# Patient Record
Sex: Male | Born: 1985
Health system: Southern US, Community
[De-identification: ages and names within clinical notes are randomized; demographics above are authoritative.]

## PROBLEM LIST (undated history)

## (undated) DIAGNOSIS — I10 Essential (primary) hypertension: Secondary | ICD-10-CM

## (undated) DIAGNOSIS — B20 Human immunodeficiency virus [HIV] disease: Secondary | ICD-10-CM

## (undated) DIAGNOSIS — Z21 Asymptomatic human immunodeficiency virus [HIV] infection status: Secondary | ICD-10-CM

## (undated) HISTORY — DX: Asymptomatic human immunodeficiency virus (hiv) infection status: Z21

## (undated) HISTORY — DX: Human immunodeficiency virus (HIV) disease: B20

---

## 2016-06-11 ENCOUNTER — Ambulatory Visit (INDEPENDENT_AMBULATORY_CARE_PROVIDER_SITE_OTHER): Payer: BLUE CROSS/BLUE SHIELD | Admitting: Physician Assistant

## 2016-06-11 VITALS — BP 170/100 | HR 93 | Temp 98.1°F | Resp 18 | Ht 71.0 in | Wt 301.0 lb

## 2016-06-11 DIAGNOSIS — K625 Hemorrhage of anus and rectum: Secondary | ICD-10-CM | POA: Diagnosis not present

## 2016-06-11 DIAGNOSIS — R079 Chest pain, unspecified: Secondary | ICD-10-CM

## 2016-06-11 DIAGNOSIS — K219 Gastro-esophageal reflux disease without esophagitis: Secondary | ICD-10-CM

## 2016-06-11 DIAGNOSIS — I10 Essential (primary) hypertension: Secondary | ICD-10-CM

## 2016-06-11 LAB — GLUCOSE, POCT (MANUAL RESULT ENTRY): POC GLUCOSE: 105 mg/dL — AB (ref 70–99)

## 2016-06-11 LAB — POCT CBC
GRANULOCYTE PERCENT: 55.2 % (ref 37–80)
HCT, POC: 39.2 % — AB (ref 43.5–53.7)
Hemoglobin: 13.6 g/dL — AB (ref 14.1–18.1)
Lymph, poc: 3.6 — AB (ref 0.6–3.4)
MCH: 28.4 pg (ref 27–31.2)
MCHC: 34.8 g/dL (ref 31.8–35.4)
MCV: 81.4 fL (ref 80–97)
MID (CBC): 1.1 — AB (ref 0–0.9)
MPV: 7.2 fL (ref 0–99.8)
PLATELET COUNT, POC: 278 10*3/uL (ref 142–424)
POC GRANULOCYTE: 5.7 (ref 2–6.9)
POC LYMPH PERCENT: 34.7 %L (ref 10–50)
POC MID %: 10.1 %M (ref 0–12)
RBC: 4.81 M/uL (ref 4.69–6.13)
RDW, POC: 14.2 %
WBC: 10.4 10*3/uL — AB (ref 4.6–10.2)

## 2016-06-11 LAB — POC HEMOCCULT BLD/STL (OFFICE/1-CARD/DIAGNOSTIC): Fecal Occult Blood, POC: NEGATIVE

## 2016-06-11 LAB — POCT URINALYSIS DIP (MANUAL ENTRY)
BILIRUBIN UA: NEGATIVE
Bilirubin, UA: NEGATIVE
GLUCOSE UA: NEGATIVE
Leukocytes, UA: NEGATIVE
Nitrite, UA: NEGATIVE
SPEC GRAV UA: 1.02
UROBILINOGEN UA: 1
pH, UA: 7

## 2016-06-11 MED ORDER — RANITIDINE HCL 150 MG PO TABS
150.0000 mg | ORAL_TABLET | Freq: Two times a day (BID) | ORAL | Status: DC
Start: 2016-06-11 — End: 2018-06-10

## 2016-06-11 MED ORDER — AMLODIPINE BESYLATE 5 MG PO TABS: 5.0000 mg | ORAL_TABLET | Freq: Every day | ORAL | Status: DC

## 2016-06-11 MED ORDER — HYDROCORTISONE ACETATE 25 MG RE SUPP: 25.0000 mg | Freq: Two times a day (BID) | RECTAL | Status: DC

## 2016-06-11 MED ORDER — OMEPRAZOLE 20 MG PO CPDR: 20.0000 mg | DELAYED_RELEASE_CAPSULE | Freq: Every day | ORAL | Status: DC

## 2016-06-11 NOTE — Patient Instructions (Addendum)
IF you received an x-ray today, you will receive an invoice from [facility]. Please contact [facility] at [phone] with questions or concerns regarding your invoice.   IF you received labwork today, you will receive an invoice from [employer]. Please contact [facility] at [phone] with questions or concerns regarding your invoice.   Our billing staff will not be able to assist you with questions regarding bills from these companies.  You will be contacted with the lab results as soon as they are available. The fastest way to get your results is to activate your My Chart account. Instructions are located on the last page of this paperwork. If you have not heard from [place] regarding the results in 2 weeks, please contact this office.    Please take the reflux medication at this time.  I would like you to not engage in strenuous exercise until you are cleared by the cardiologist. Please await this contact for cardiology.   Follow the gerd restriction diet at this time. I would like you to take the amlodipine at this time.  Let's follow up in 2 weeks, unless this is performed with the cardiologist.  I need you to also look at this DASH diet and be mindful of your diet.  DASH Eating Plan DASH stands for "Dietary Approaches to Stop Hypertension." The DASH eating plan is a healthy eating plan that has been shown to reduce high blood pressure (hypertension). Additional health benefits may include reducing the risk of type 2 diabetes mellitus, heart disease, and stroke. The DASH eating plan may also help with weight loss. WHAT DO I NEED TO KNOW ABOUT THE DASH EATING PLAN? For the DASH eating plan, you will follow these general guidelines:  Choose foods with a percent daily value for sodium of less than 5% (as listed on the food label).  Use salt-free seasonings or herbs instead of table salt or sea salt.  Check with your health care provider or  pharmacist before using salt substitutes.  Eat lower-sodium products, often labeled as "lower sodium" or "no salt added."  Eat fresh foods.  Eat more vegetables, fruits, and low-fat dairy products.  Choose whole grains. Look for the word "whole" as the first word in the ingredient list.  Choose fish and skinless chicken or [place] more often than red meat. Limit fish, poultry, and meat to 6 oz (170 g) each day.  Limit sweets, desserts, sugars, and sugary drinks.  Choose heart-healthy fats.  Limit cheese to 1 oz (28 g) per day.  Eat more home-cooked food and less restaurant, buffet, and fast food.  Limit fried foods.  Cook foods using methods other than frying.  Limit canned vegetables. If you do use them, rinse them well to decrease the sodium.  When eating at a restaurant, ask that your food be prepared with less salt, or no salt if possible. WHAT FOODS CAN I EAT? Seek help from a dietitian for individual calorie needs. Grains Whole grain or whole wheat bread. Brown rice. Whole grain or whole wheat pasta. Quinoa, bulgur, and whole grain cereals. Low-sodium cereals. Corn or whole wheat flour tortillas. Whole grain cornbread. Whole grain crackers. Low-sodium crackers. Vegetables Fresh or frozen vegetables (raw, steamed, roasted, or grilled). Low-sodium or reduced-sodium tomato and vegetable juices. Low-sodium or reduced-sodium tomato sauce and paste. Low-sodium or reduced-sodium canned vegetables.  Fruits All fresh, canned (in natural juice), or frozen fruits. Meat and Other Protein Products Ground beef (85% or leaner), grass-fed  beef, or beef trimmed of fat. Skinless chicken or [place]. Ground chicken or [place]. Pork trimmed of fat. All fish and seafood. Eggs. Dried beans, peas, or lentils. Unsalted nuts and seeds. Unsalted canned beans. Dairy Low-fat dairy products, such as skim or 1% milk, 2% or reduced-fat cheeses, low-fat ricotta or cottage cheese, or plain low-fat yogurt.  Low-sodium or reduced-sodium cheeses. Fats and Oils Tub margarines without trans fats. Light or reduced-fat mayonnaise and salad dressings (reduced sodium). Avocado. Safflower, olive, or canola oils. Natural peanut or almond butter. Other Unsalted popcorn and pretzels. The items listed above may not be a complete list of recommended foods or beverages. Contact your dietitian for more options. WHAT FOODS ARE NOT RECOMMENDED? Grains White bread. White pasta. White rice. Refined cornbread. Bagels and croissants. Crackers that contain trans fat. Vegetables Creamed or fried vegetables. Vegetables in a cheese sauce. Regular canned vegetables. Regular canned tomato sauce and paste. Regular tomato and vegetable juices. Fruits Dried fruits. Canned fruit in light or heavy syrup. Fruit juice. Meat and Other Protein Products Fatty cuts of meat. Ribs, chicken wings, bacon, sausage, bologna, salami, chitterlings, fatback, hot dogs, bratwurst, and packaged luncheon meats. Salted nuts and seeds. Canned beans with salt. Dairy Whole or 2% milk, cream, half-and-half, and cream cheese. Whole-fat or sweetened yogurt. Full-fat cheeses or blue cheese. Nondairy creamers and whipped toppings. Processed cheese, cheese spreads, or cheese curds. Condiments Onion and garlic salt, seasoned salt, table salt, and sea salt. Canned and packaged gravies. Worcestershire sauce. Tartar sauce. Barbecue sauce. Teriyaki sauce. Soy sauce, including reduced sodium. Steak sauce. Fish sauce. Oyster sauce. Cocktail sauce. Horseradish. Ketchup and mustard. Meat flavorings and tenderizers. Bouillon cubes. Hot sauce. Tabasco sauce. Marinades. Taco seasonings. Relishes. Fats and Oils Butter, stick margarine, lard, shortening, ghee, and bacon fat. Coconut, palm kernel, or palm oils. Regular salad dressings. Other Pickles and olives. Salted popcorn and pretzels. The items listed above may not be a complete list of foods and beverages to avoid.  Contact your dietitian for more information. WHERE CAN I FIND MORE INFORMATION? National Heart, Lung, and Blood Institute: [employer]   This information is not intended to replace advice given to you by your health care provider. Make sure you discuss any questions you have with your health care provider.   Document Released: 11/08/2011 Document Revised: 12/10/2014 Document Reviewed: 09/23/2013 Elsevier Interactive Patient Education 2016 [employer].  Food Choices for Gastroesophageal Reflux Disease, Adult When you have gastroesophageal reflux disease (GERD), the foods you eat and your eating habits are very important. Choosing the right foods can help ease the discomfort of GERD. WHAT GENERAL GUIDELINES DO I NEED TO FOLLOW?  Choose fruits, vegetables, whole grains, low-fat dairy products, and low-fat meat, fish, and poultry.  Limit fats such as oils, salad dressings, butter, nuts, and avocado.  Keep a food diary to identify foods that cause symptoms.  Avoid foods that cause reflux. These may be different for different people.  Eat frequent small meals instead of three large meals each day.  Eat your meals slowly, in a relaxed setting.  Limit fried foods.  Cook foods using methods other than frying.  Avoid drinking alcohol.  Avoid drinking large amounts of liquids with your meals.  Avoid bending over or lying down until 2-3 hours after eating. WHAT FOODS ARE NOT RECOMMENDED? The following are some foods and drinks that may worsen your symptoms: Vegetables Tomatoes. Tomato juice. Tomato and spaghetti sauce. Chili peppers. Onion and garlic. Horseradish. Fruits Oranges, grapefruit, and lemon (fruit  and juice). Meats High-fat meats, fish, and poultry. This includes hot dogs, ribs, ham, sausage, salami, and bacon. Dairy Whole milk and chocolate milk. Sour cream. Cream. Butter. Ice cream. Cream cheese.  Beverages Coffee and tea, with  or without caffeine. Carbonated beverages or energy drinks. Condiments Hot sauce. Barbecue sauce.  Sweets/Desserts Chocolate and cocoa. Donuts. Peppermint and spearmint. Fats and Oils High-fat foods, including [place] fries and potato chips. Other Vinegar. Strong spices, such as black pepper, white pepper, red pepper, cayenne, curry powder, cloves, ginger, and chili powder. The items listed above may not be a complete list of foods and beverages to avoid. Contact your dietitian for more information.   This information is not intended to replace advice given to you by your health care provider. Make sure you discuss any questions you have with your health care provider.   Document Released: 11/19/2005 Document Revised: 12/10/2014 Document Reviewed: 09/23/2013 Elsevier Interactive Patient Education [employer]2016 Elsevier Inc.

## 2016-06-12 ENCOUNTER — Ambulatory Visit: Payer: Self-pay

## 2016-06-12 LAB — COMPLETE METABOLIC PANEL WITH GFR
ALBUMIN: 4.1 g/dL (ref 3.6–5.1)
ALK PHOS: 80 U/L (ref 40–115)
ALT: 37 U/L (ref 9–46)
AST: 20 U/L (ref 10–40)
BILIRUBIN TOTAL: 0.3 mg/dL (ref 0.2–1.2)
BUN: 7 mg/dL (ref 7–25)
CALCIUM: 9.3 mg/dL (ref 8.6–10.3)
CO2: 28 mmol/L (ref 20–31)
CREATININE: 0.9 mg/dL (ref 0.60–1.35)
Chloride: 103 mmol/L (ref 98–110)
Glucose, Bld: 103 mg/dL — ABNORMAL HIGH (ref 65–99)
Potassium: 4.1 mmol/L (ref 3.5–5.3)
Sodium: 139 mmol/L (ref 135–146)
TOTAL PROTEIN: 8.5 g/dL — AB (ref 6.1–8.1)

## 2016-06-14 NOTE — Progress Notes (Signed)
[facility] [address], [place] [address] [phone]  Date:  06/11/2016   Name:  Peter Herring   DOB:  19-Sep-1986   MRN:  [id number]  PCP:  PROVIDER NOT IN SYSTEM    History of Present Illness:  Peter Herring is a 30 y.o. male patient with HIV, who presents to [facility] for cc of chest pain.   --chest pain intermittently for 1 year.  It is characterized as a pressure pain across the top of his chest.  Without sob or dyspnea.  No association.  There is some nausea.  No dizziness or leg swelling.  He has a hx of hypertension.  Was taken off meds by pcp due to elevated bp resolving.   --yesterday, he developed rectal bleeding.  It was generous on the toilet paper, and minimal in stool.  No black stool.  He has had no blood on his underwear today.  He recalls that he did have constipation yesterday.  He is sexual active with anal intercourse.  No fever, unintentional weight loss.      There are no active problems to display for this patient.   Past Medical History  Diagnosis Date  . HIV infection (HCC)     No past surgical history on file.  Social History  Substance Use Topics  . Smoking status: Former Games developer  . Smokeless tobacco: None  . Alcohol Use: None    Family History  Problem Relation Age of Onset  . Cancer Mother   . Diabetes Mother   . Diabetes Father   . Heart disease Father   . Diabetes Sister     Not on File  Medication list has been reviewed and updated.  No current outpatient prescriptions on file prior to visit.   No current facility-administered medications on file prior to visit.    ROS ROS otherwise unremarkable unless listed above.   Physical Examination: BP 170/100 mmHg  Pulse 93  Temp(Src) 98.1 F (36.7 C) (Oral)  Resp 18  Ht  (1.803 m)  Wt 301 lb (136.533 kg)  BMI 42.00 kg/m2  SpO2 99% Ideal Body Weight: Weight in (lb) to have BMI = 25: 178.9 Physical Exam  Constitutional: He is oriented to person,  place, and time. He appears well-developed and well-nourished. No distress.  HENT:  Head: Normocephalic and atraumatic.  Eyes: Conjunctivae and EOM are normal. Pupils are equal, round, and reactive to light.  Cardiovascular: Normal rate and regular rhythm.  Exam reveals no gallop and no friction rub.   No murmur heard. Pulses:      Carotid pulses are 2+ on the right side, and 2+ on the left side.      Dorsalis pedis pulses are 2+ on the right side, and 2+ on the left side.  Pulmonary/Chest: Effort normal. No respiratory distress.  Genitourinary: Rectum normal. Rectal exam shows no mass and anal tone normal. Guaiac negative stool.  gu with 6 oclock pallor at the anus with minimal tenderness.  No hemorrhoid detected internal or external.    Neurological: He is alert and oriented to person, place, and time.  Skin: Skin is warm and dry. He is not diaphoretic.  Psychiatric: He has a normal mood and affect. His behavior is normal.   Results for orders placed or performed in visit on 06/11/16  COMPLETE METABOLIC PANEL WITH GFR  Result Value Ref Range   Sodium 139 135 - 146 mmol/L   Potassium 4.1 3.5 - 5.3 mmol/L  Chloride 103 98 - 110 mmol/L   CO2 28 20 - 31 mmol/L   Glucose, Bld 103 (H) 65 - 99 mg/dL   BUN 7 7 - 25 mg/dL   Creat 1.610.90 0.960.60 - 0.451.35 mg/dL   Total Bilirubin 0.3 0.2 - 1.2 mg/dL   Alkaline Phosphatase 80 40 - 115 U/L   AST 20 10 - 40 U/L   ALT 37 9 - 46 U/L   Total Protein 8.5 (H) 6.1 - 8.1 g/dL   Albumin 4.1 3.6 - 5.1 g/dL   Calcium 9.3 8.6 - 40.910.3 mg/dL   GFR, Est African American >89 >=60 mL/min   GFR, Est Non African American >89 >=60 mL/min  POCT CBC  Result Value Ref Range   WBC 10.4 (A) 4.6 - 10.2 K/uL   Lymph, poc 3.6 (A) 0.6 - 3.4   POC LYMPH PERCENT 34.7 10 - 50 %L   MID (cbc) 1.1 (A) 0 - 0.9   POC MID % 10.1 0 - 12 %M   POC Granulocyte 5.7 2 - 6.9   Granulocyte percent 55.2 37 - 80 %G   RBC 4.81 4.69 - 6.13 M/uL   Hemoglobin 13.6 (A) 14.1 - 18.1 g/dL    HCT, POC 81.139.2 (A) 91.443.5 - 53.7 %   MCV 81.4 80 - 97 fL   MCH, POC 28.4 27 - 31.2 pg   MCHC 34.8 31.8 - 35.4 g/dL   RDW, POC 78.214.2 %   Platelet Count, POC 278 142 - 424 K/uL   MPV 7.2 0 - 99.8 fL  POCT urinalysis dipstick  Result Value Ref Range   Color, UA yellow yellow   Clarity, UA clear clear   Glucose, UA negative negative   Bilirubin, UA negative negative   Ketones, POC UA negative negative   Spec Grav, UA 1.020    Blood, UA trace-intact (A) negative   pH, UA 7.0    Protein Ur, POC trace (A) negative   Urobilinogen, UA 1.0    Nitrite, UA Negative Negative   Leukocytes, UA Negative Negative  POCT glucose (manual entry)  Result Value Ref Range   POC Glucose 105 (A) 70 - 99 mg/dl  POC Hemoccult Bld/Stl (1-Cd Office Dx)  Result Value Ref Range   Card #1 Date 06/11/2016    Fecal Occult Blood, POC Negative Negative    Assessment and Plan: Peter Herring is a 30 y.o. male who is here today for chest pain. --likely GERD, however this should be cleared by a cardiologist.  Cardiology consult appreciated at this time. --given h2 blocker and PPI --rectal bleeding possible fissure, however will treat with anusol.  Advised miralax use. --bp rechecked at 154/98 Chest pain, unspecified chest pain type - Plan: EKG 12-Lead, POCT CBC, POCT urinalysis dipstick, COMPLETE METABOLIC PANEL WITH GFR, POCT glucose (manual entry), Ambulatory referral to Cardiology, omeprazole (PRILOSEC) 20 MG capsule, ranitidine (ZANTAC) 150 MG tablet  Rectal bleeding - Plan: POC Hemoccult Bld/Stl (1-Cd Office Dx), hydrocortisone (ANUSOL-HC) 25 MG suppository  Gastroesophageal reflux disease, esophagitis presence not specified  Essential hypertension - Plan: amLODipine (NORVASC) 5 MG tablet  Trena PlattStephanie English, PA-C [facility] 06/14/2016 4:33 PM  Will change to lisinopril--given self-reported use of retro antiviral, prevcobix.

## 2016-06-15 ENCOUNTER — Telehealth: Payer: Self-pay | Admitting: Physician Assistant

## 2016-06-15 MED ORDER — LISINOPRIL 10 MG PO TABS: 10.0000 mg | ORAL_TABLET | Freq: Every day | ORAL | Status: DC

## 2016-06-15 NOTE — Telephone Encounter (Signed)
Please contact patient, and advise that I am going to switch his med to lisinopril.  I do not want him to take this with his current medicines.  Please stop the amlodipine.

## 2016-06-15 NOTE — Telephone Encounter (Signed)
Attempted to call pt, left VM for pt to call back asap  

## 2016-06-19 NOTE — Telephone Encounter (Signed)
[facility]

## 2016-06-21 NOTE — Telephone Encounter (Signed)
Sent unable to reach letter and send a copy of the message.

## 2018-01-01 ENCOUNTER — Ambulatory Visit: Payer: BLUE CROSS/BLUE SHIELD | Admitting: Emergency Medicine

## 2018-01-01 DIAGNOSIS — J1089 Influenza due to other identified influenza virus with other manifestations: Secondary | ICD-10-CM | POA: Diagnosis not present

## 2018-01-01 DIAGNOSIS — R030 Elevated blood-pressure reading, without diagnosis of hypertension: Secondary | ICD-10-CM | POA: Diagnosis not present

## 2018-04-15 ENCOUNTER — Ambulatory Visit (HOSPITAL_COMMUNITY)
Admission: EM | Admit: 2018-04-15 | Discharge: 2018-04-15 | Disposition: A | Discharge status: Home / Self Care | Payer: BLUE CROSS/BLUE SHIELD | Attending: Family Medicine | Admitting: Family Medicine

## 2018-04-15 ENCOUNTER — Encounter (HOSPITAL_COMMUNITY): Payer: Self-pay | Admitting: Family Medicine

## 2018-04-15 DIAGNOSIS — I10 Essential (primary) hypertension: Secondary | ICD-10-CM

## 2018-04-15 DIAGNOSIS — R0789 Other chest pain: Secondary | ICD-10-CM | POA: Diagnosis not present

## 2018-04-15 HISTORY — DX: Essential (primary) hypertension: I10

## 2018-04-15 LAB — POCT I-STAT, CHEM 8
BUN: 6 mg/dL (ref 6–20)
CREATININE: 0.7 mg/dL (ref 0.61–1.24)
Calcium, Ion: 1.17 mmol/L (ref 1.15–1.40)
Chloride: 105 mmol/L (ref 101–111)
GLUCOSE: 112 mg/dL — AB (ref 65–99)
HCT: 41 % (ref 39.0–52.0)
HEMOGLOBIN: 13.9 g/dL (ref 13.0–17.0)
POTASSIUM: 3.4 mmol/L — AB (ref 3.5–5.1)
Sodium: 142 mmol/L (ref 135–145)
TCO2: 24 mmol/L (ref 22–32)

## 2018-04-15 MED ORDER — LISINOPRIL 10 MG PO TABS: 10.0000 mg | ORAL_TABLET | Freq: Every day | ORAL | 1 refills | Status: DC

## 2018-04-15 MED ORDER — AMLODIPINE BESYLATE 5 MG PO TABS
5.0000 mg | ORAL_TABLET | Freq: Every day | ORAL | 1 refills | Status: DC
Start: 2018-04-15 — End: 2018-06-10

## 2018-04-15 NOTE — ED Provider Notes (Signed)
[facility]    CSN: [id number] Arrival date & time: 04/15/18  1210     History   Chief Complaint Chief Complaint  Patient presents with  . Hypertension    HPI Gergory Biello is a 32 y.o. male.   32 year old male with history of HIV, hypertension comes in due to hypertension.  States moved here from [place] 2.5 years ago, and discontinued all his medications as he could not find a PCP/ID provider.  States he checked his blood pressure at [employer], with systolic in the 180s, and came in for evaluation.  States he thought he'd check as it was available, denies symptoms leading him to check.  States he has chronic chest pain that is substernal that he has had for years that is usually constant without aggravating factor.  States when laying down, chest pain resolves.  He associates the pain with his weight.  States he has dyspnea on exertion that is stable, he associates that to deconditioning.  Denies palpitation, wheezing, leg swelling.  Denies dizziness, weakness, headache, syncope.  Denies exertional chest pain.  He has been off his antivirals for about 2.5 years.  Former smoker. Family history of heart disease, father with CHF at age 86.      Past Medical History:  Diagnosis Date  . HIV infection (HCC)   . Hypertension     There are no active problems to display for this patient.   History reviewed. No pertinent surgical history.     Home Medications    Prior to Admission medications   Medication Sig Start Date End Date Taking? Authorizing Provider  amLODipine (NORVASC) 5 MG tablet Take 1 tablet (5 mg total) by mouth daily. 04/15/18   Belinda Fisher, PA-C  hydrocortisone (ANUSOL-HC) 25 MG suppository Place 1 suppository (25 mg total) rectally 2 (two) times daily. 06/11/16   Trena Platt D, PA  lisinopril (PRINIVIL,ZESTRIL) 10 MG tablet Take 1 tablet (10 mg total) by mouth daily. 04/15/18   Cathie Hoops, Nedim Oki V, PA-C  omeprazole (PRILOSEC) 20 MG capsule Take 1 capsule  (20 mg total) by mouth daily. 06/11/16   Trena Platt D, PA  ranitidine (ZANTAC) 150 MG tablet Take 1 tablet (150 mg total) by mouth 2 (two) times daily. 06/11/16   Garnetta Buddy, PA    Family History Family History  Problem Relation Age of Onset  . Cancer Mother   . Diabetes Mother   . Diabetes Father   . Heart disease Father   . Diabetes Sister     Social History Social History   Tobacco Use  . Smoking status: Former Games developer  . Smokeless tobacco: Never Used  Substance Use Topics  . Alcohol use: Not on file  . Drug use: Not on file     Allergies   Patient has no known allergies.   Review of Systems Review of Systems  Reason unable to perform ROS: See HPI as above.     Physical Exam Triage Vital Signs ED Triage Vitals  Enc Vitals Group     BP 04/15/18 1222 (!) 178/103     Pulse Rate 04/15/18 1222 84     Resp 04/15/18 1222 18     Temp 04/15/18 1222 97.6 F (36.4 C)     Temp Source 04/15/18 1222 Oral     SpO2 04/15/18 1222 99 %     Weight --      Height --      Head Circumference --  Peak Flow --      Pain Score 04/15/18 1226 0     Pain Loc --      Pain Edu? --      Excl. in GC? --    No data found.  Updated Vital Signs BP (!) 178/103 (BP Location: Right Arm)   Pulse 84   Temp 97.6 F (36.4 C) (Oral)   Resp 18   SpO2 99%    Physical Exam  Constitutional: He is oriented to person, place, and time. He appears well-developed and well-nourished. No distress.  HENT:  Head: Normocephalic and atraumatic.  Eyes: Pupils are equal, round, and reactive to light. Conjunctivae and EOM are normal.  Neck: Normal range of motion. Neck supple.  Cardiovascular: Normal rate and regular rhythm. Exam reveals no gallop and no friction rub.  No murmur heard. Pulmonary/Chest: Effort normal and breath sounds normal. No stridor. No respiratory distress. He has no wheezes. He has no rales. He exhibits no tenderness.  Neurological: He is alert and oriented  to person, place, and time.  Skin: Skin is warm and dry. He is not diaphoretic.     UC Treatments / Results  Labs (all labs ordered are listed, but only abnormal results are displayed) Labs Reviewed  POCT I-STAT, CHEM 8 - Abnormal; Notable for the following components:      Result Value   Potassium 3.4 (*)    Glucose, Bld 112 (*)    All other components within normal limits    EKG None  Radiology No results found.  Procedures Procedures (including critical care time)  Medications Ordered in UC Medications - No data to display  Initial Impression / Assessment and Plan / UC Course  I have reviewed the triage vital signs and the nursing notes.  Pertinent labs & imaging results that were available during my care of the patient were reviewed by me and considered in my medical decision making (see chart for details).    No alarming signs on exam. EKG NSR, 80bpm, with borderline LVH, prolonged QT. Will restart antihypertensives, lisinopril and norvasc. Patient to follow up with cardiology for further evaluation. Establish PCP care for further management of uncontrolled HTN. Establish ID care for further management and evaluation of HIV. Strict return precautions given. Patient expresses understanding and agrees to plan.  Case discussed with Dr Delton See, who agrees to plan.  Final Clinical Impressions(s) / UC Diagnoses   Final diagnoses:  Essential hypertension    ED Prescriptions    Medication Sig Dispense Auth. Provider   amLODipine (NORVASC) 5 MG tablet Take 1 tablet (5 mg total) by mouth daily. 30 tablet Shaida Route V, PA-C   lisinopril (PRINIVIL,ZESTRIL) 10 MG tablet Take 1 tablet (10 mg total) by mouth daily. 30 tablet Threasa Alpha, [place] 04/15/18 1337

## 2018-04-15 NOTE — Discharge Instructions (Addendum)
EKG with some changes that is likely due to uncontrolled high blood pressure. Please start lisinopril and amlodipine as directed. Your potasium was slightly low, you can eat bananas to help with that. I would like you to follow up with a cardiologist to assess your chest pain given uncontrolled high blood pressure. I have also attached information of infectious disease doctors to follow up with to control your HIV. Please establish PCP care for further evaluation and management needed.  If experiencing worsening chest pain, shortness of breath, palpitations, weakness, dizziness, passing out, fever, please go to the emergency department for further evaluation.

## 2018-04-15 NOTE — ED Triage Notes (Addendum)
Pt here for HTN. He has hx of HTN and used to be on meds. sts he hasn't had any meds in years. sts some SOB with activity. Denies dizziness and headache.

## 2018-05-26 ENCOUNTER — Ambulatory Visit: Payer: BLUE CROSS/BLUE SHIELD

## 2018-05-26 ENCOUNTER — Other Ambulatory Visit: Payer: BLUE CROSS/BLUE SHIELD

## 2018-05-26 ENCOUNTER — Other Ambulatory Visit (HOSPITAL_COMMUNITY)
Admission: RE | Admit: 2018-05-26 | Discharge: 2018-05-26 | Disposition: A | Discharge status: Home / Self Care | Payer: BLUE CROSS/BLUE SHIELD | Source: Ambulatory Visit | Attending: Family | Admitting: Family

## 2018-05-26 ENCOUNTER — Other Ambulatory Visit: Payer: Self-pay | Admitting: *Deleted

## 2018-05-26 DIAGNOSIS — Z79899 Other long term (current) drug therapy: Secondary | ICD-10-CM

## 2018-05-26 DIAGNOSIS — B20 Human immunodeficiency virus [HIV] disease: Secondary | ICD-10-CM | POA: Diagnosis not present

## 2018-05-26 DIAGNOSIS — Z113 Encounter for screening for infections with a predominantly sexual mode of transmission: Secondary | ICD-10-CM

## 2018-05-27 LAB — T-HELPER CELL (CD4) - (RCID CLINIC ONLY)
CD4 T CELL HELPER: 37 % (ref 33–55)
CD4 T Cell Abs: 810 /uL (ref 400–2700)

## 2018-05-27 LAB — URINALYSIS
Bilirubin Urine: NEGATIVE
Glucose, UA: NEGATIVE
Hgb urine dipstick: NEGATIVE
Leukocytes, UA: NEGATIVE
NITRITE: NEGATIVE
SPECIFIC GRAVITY, URINE: 1.027 (ref 1.001–1.03)

## 2018-05-27 LAB — URINE CYTOLOGY ANCILLARY ONLY
Chlamydia: NEGATIVE
Neisseria Gonorrhea: NEGATIVE

## 2018-05-28 LAB — QUANTIFERON-TB GOLD PLUS
NIL: 0.06 [IU]/mL
QuantiFERON-TB Gold Plus: NEGATIVE
TB1-NIL: 0 IU/mL
TB2-NIL: 0.02 [IU]/mL

## 2018-06-02 LAB — COMPLETE METABOLIC PANEL WITH GFR
AG Ratio: 0.8 (calc) — ABNORMAL LOW (ref 1.0–2.5)
ALBUMIN MSPROF: 4 g/dL (ref 3.6–5.1)
ALT: 31 U/L (ref 9–46)
AST: 22 U/L (ref 10–40)
Alkaline phosphatase (APISO): 73 U/L (ref 40–115)
BUN: 11 mg/dL (ref 7–25)
CO2: 25 mmol/L (ref 20–32)
CREATININE: 0.9 mg/dL (ref 0.60–1.35)
Calcium: 9.7 mg/dL (ref 8.6–10.3)
Chloride: 102 mmol/L (ref 98–110)
GFR, Est African American: 129 mL/min/{1.73_m2} (ref >=60)
GFR, Est Non African American: 111 mL/min/{1.73_m2} (ref >=60)
GLUCOSE: 131 mg/dL — AB (ref 65–99)
Globulin: 5.3 g/dL (calc) — ABNORMAL HIGH (ref 1.9–3.7)
Potassium: 3.8 mmol/L (ref 3.5–5.3)
Sodium: 136 mmol/L (ref 135–146)
Total Bilirubin: 0.3 mg/dL (ref 0.2–1.2)
Total Protein: 9.3 g/dL — ABNORMAL HIGH (ref 6.1–8.1)

## 2018-06-02 LAB — LIPID PANEL
CHOL/HDL RATIO: 3.9 (calc) (ref <5.0)
Cholesterol: 191 mg/dL (ref <200)
HDL: 49 mg/dL (ref >40)
LDL Cholesterol (Calc): 113 mg/dL (calc) — ABNORMAL HIGH
NON-HDL CHOLESTEROL (CALC): 142 mg/dL — AB (ref <130)
Triglycerides: 174 mg/dL — ABNORMAL HIGH (ref <150)

## 2018-06-02 LAB — HEPATITIS B SURFACE ANTIBODY,QUALITATIVE: Hep B S Ab: REACTIVE — AB

## 2018-06-02 LAB — CBC WITH DIFFERENTIAL/PLATELET
BASOS PCT: 0.6 %
Basophils Absolute: 43 cells/uL (ref 0–200)
EOS PCT: 1 %
Eosinophils Absolute: 72 cells/uL (ref 15–500)
HEMATOCRIT: 41.3 % (ref 38.5–50.0)
Hemoglobin: 14.1 g/dL (ref 13.2–17.1)
LYMPHS ABS: 2225 {cells}/uL (ref 850–3900)
MCH: 27.7 pg (ref 27.0–33.0)
MCHC: 34.1 g/dL (ref 32.0–36.0)
MCV: 81.1 fL (ref 80.0–100.0)
MPV: 9.9 fL (ref 7.5–12.5)
Monocytes Relative: 7.7 %
NEUTROS ABS: 4306 {cells}/uL (ref 1500–7800)
Neutrophils Relative %: 59.8 %
Platelets: 325 10*3/uL (ref 140–400)
RBC: 5.09 10*6/uL (ref 4.20–5.80)
RDW: 12.9 % (ref 11.0–15.0)
Total Lymphocyte: 30.9 %
WBC: 7.2 10*3/uL (ref 3.8–10.8)
WBCMIX: 554 {cells}/uL (ref 200–950)

## 2018-06-02 LAB — HIV-1/2 AB - DIFFERENTIATION
HIV 2 AB: NEGATIVE
HIV-1 antibody: POSITIVE — AB

## 2018-06-02 LAB — HEPATITIS C ANTIBODY
Hepatitis C Ab: NONREACTIVE
SIGNAL TO CUT-OFF: 0.14 (ref <1.00)

## 2018-06-02 LAB — HEPATITIS B CORE ANTIBODY, TOTAL: HEP B C TOTAL AB: NONREACTIVE

## 2018-06-02 LAB — HLA B*5701: HLA-B*5701 w/rflx HLA-B High: NEGATIVE

## 2018-06-02 LAB — HIV ANTIBODY (ROUTINE TESTING W REFLEX): HIV: REACTIVE — AB

## 2018-06-02 LAB — RPR: RPR Ser Ql: NONREACTIVE

## 2018-06-02 LAB — HEPATITIS B SURFACE ANTIGEN: Hepatitis B Surface Ag: NONREACTIVE

## 2018-06-02 LAB — HEPATITIS A ANTIBODY, TOTAL: HEPATITIS A AB,TOTAL: REACTIVE — AB

## 2018-06-05 LAB — HIV-1 RNA ULTRAQUANT REFLEX TO GENTYP+
HIV 1 RNA QUANT: 1520 {copies}/mL — AB
HIV-1 RNA QUANT, LOG: 3.18 {Log_copies}/mL — AB

## 2018-06-05 LAB — HIV-1 GENOTYPE: HIV-1 GENOTYPE: DETECTED — AB

## 2018-06-10 ENCOUNTER — Ambulatory Visit: Payer: BLUE CROSS/BLUE SHIELD | Admitting: Pharmacist Clinician (PhC)/ Clinical Pharmacy Specialist

## 2018-06-10 ENCOUNTER — Encounter: Payer: Self-pay | Admitting: Family

## 2018-06-10 ENCOUNTER — Ambulatory Visit: Payer: BLUE CROSS/BLUE SHIELD | Admitting: Family

## 2018-06-10 VITALS — BP 155/105 | HR 79 | Temp 98.3°F | Ht 71.0 in | Wt 317.0 lb

## 2018-06-10 DIAGNOSIS — B20 Human immunodeficiency virus [HIV] disease: Secondary | ICD-10-CM | POA: Diagnosis not present

## 2018-06-10 DIAGNOSIS — I10 Essential (primary) hypertension: Secondary | ICD-10-CM | POA: Diagnosis not present

## 2018-06-10 MED ORDER — AMLODIPINE BESYLATE 10 MG PO TABS: 10.0000 mg | ORAL_TABLET | Freq: Every day | ORAL | 1 refills | Status: DC

## 2018-06-10 MED ORDER — BICTEGRAVIR-EMTRICITAB-TENOFOV 50-200-25 MG PO TABS: 1.0000 | ORAL_TABLET | Freq: Every day | ORAL | 1 refills | Status: DC

## 2018-06-10 MED ORDER — LISINOPRIL 10 MG PO TABS: 10.0000 mg | ORAL_TABLET | Freq: Every day | ORAL | 1 refills | Status: DC

## 2018-06-10 MED FILL — AMLODIPINE BESYLATE 10 MG T: 10 | 30 days supply | Qty: 30 | Fill #0

## 2018-06-10 MED FILL — BIKTARVY 50-200-25 MG TABS: 50-200-25 | 30 days supply | Qty: 30 | Fill #0

## 2018-06-10 MED FILL — LISINOPRIL 10 MG TABLET: 10 | 30 days supply | Qty: 30 | Fill #0

## 2018-06-10 NOTE — Assessment & Plan Note (Addendum)
HIV disease not currently on medication and has been out of care for about 2 years. He has resistance presence with a K103N. Most recent viral load of 1500 and CD4 of 810. Question possible non-progresser. No evidence of opportunistic infection through history or physical exam. Will start him on Biktarvy with plan to follow up in 1 month to recheck viral load and CD4 count. Declines condoms. Immunizations appear up-to-date.

## 2018-06-10 NOTE — Progress Notes (Signed)
Subjective:    Patient ID: Peter Herring, male    DOB: 08/07/1986, 32 y.o.   MRN: [id number]  Chief Complaint  Patient presents with  . New Patient (Initial Visit)    HPI:  Peter Herring is a 32 y.o. male who presents today to establish care for his HIV disease.  1.) HIV - [name] was initially diagnosed with HIV in 2008 when he was in relationship with someone who was HIV positive. He started medication therapy with Atripla shortly after diagnosis. Then switched to Norvir/Rayotaz, and unsure Truvada and most recently on Descovy and Prescobix. He has been off medications since 2017. Denies fevers, chills, night sweats, headaches, changes in vision, neck pain/stiffness, nausea, diarrhea, vomiting, lesions or rashes. 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 He completed blood work on 05/26/18 with a viral load of 1520 and CD4 count of 810. Negative for gonorrhea, chlamydia, and syphilis. Lipid profile with mild elevation in LDL.   Immunizations per chart:  Prevnar - 12/22/2013 Pneumovax - 03/24/2014 Meningococcal - 04/17/2016 Tdap - 03/24/2014 Hepatitis B Immune  2.) Hypertension - Previously diagnosed with hypertension at an Urgent Care and is currently prescribed amlodipine and lisinopril. He reports taking the medication as prescribed with no adverse side effects or signs of hypotension. Does not currently check his blood pressure at home. Denies chest pain, shortness of breath, headaches, changes in vision, or dyspnea on exertion.    BP Readings from Last 3 Encounters:  06/10/18 (!) 155/105  04/15/18 (!) 178/103  06/11/16 (!) 170/100    No Known Allergies    Outpatient Medications Prior to Visit  Medication Sig Dispense Refill  . amLODipine (NORVASC) 5 MG tablet Take 1 tablet (5 mg total) by mouth daily. 30 tablet 1  . lisinopril (PRINIVIL,ZESTRIL) 10 MG tablet Take 1 tablet (10 mg total) by mouth  daily. 30 tablet 1  . hydrocortisone (ANUSOL-HC) 25 MG suppository Place 1 suppository (25 mg total) rectally 2 (two) times daily. 12 suppository 0  . omeprazole (PRILOSEC) 20 MG capsule Take 1 capsule (20 mg total) by mouth daily. (Patient not taking: Reported on 06/10/2018) 30 capsule 3  . ranitidine (ZANTAC) 150 MG tablet Take 1 tablet (150 mg total) by mouth 2 (two) times daily. (Patient not taking: Reported on 06/10/2018) 60 tablet 0   No facility-administered medications prior to visit.      Past Medical History:  Diagnosis Date  . HIV infection (HCC)   . Hypertension       History reviewed. No pertinent surgical history.    Family History  Problem Relation Age of Onset  . Cancer Mother   . Diabetes Mother   . Diabetes Father   . Heart disease Father   . Diabetes Sister       Social History   Socioeconomic History  . Marital status: Single    Spouse name: Not on file  . Number of children: Not on file  . Years of education: Not on file  . Highest education level: Not on file  Occupational History  . Occupation: Art therapistGeneral Manager  Social Needs  . Financial resource strain: Not on file  . Food insecurity:    Worry: Not on file    Inability: Not on file  . Transportation needs:    Medical: Not on file    Non-medical: Not on file  Tobacco Use  . Smoking status: Former Smoker  Packs/day: 0.50    Years: 12.00    Pack years: 6.00    Types: Cigarettes  . Smokeless tobacco: Never Used  Substance and Sexual Activity  . Alcohol use: Never    Alcohol/week: 0.0 oz    Frequency: Never  . Drug use: Never  . Sexual activity: Not on file  Lifestyle  . Physical activity:    Days per week: Not on file    Minutes per session: Not on file  . Stress: Not on file  Relationships  . Social connections:    Talks on phone: Not on file    Gets together: Not on file    Attends religious service: Not on file    Active member of club or organization: Not on file     Attends meetings of clubs or organizations: Not on file    Relationship status: Not on file  . Intimate partner violence:    Fear of current or ex partner: Not on file    Emotionally abused: Not on file    Physically abused: Not on file    Forced sexual activity: Not on file  Other Topics Concern  . Not on file  Social History Narrative  . Not on file    Review of Systems  Constitutional: Negative for activity change, appetite change, chills, diaphoresis, fatigue, fever and unexpected weight change.  HENT: Negative for congestion, sinus pressure and sore throat.   Eyes:       Negative for changes in vision  Respiratory: Negative for cough, chest tightness, shortness of breath and wheezing.   Cardiovascular: Negative for chest pain, palpitations and leg swelling.  Gastrointestinal: Negative for abdominal pain, constipation, diarrhea, nausea and vomiting.  Genitourinary: Negative for dysuria, flank pain, frequency, genital sores, hematuria and urgency.  Neurological: Negative for dizziness, weakness, light-headedness and headaches.       Objective:    BP (!) 155/105   Pulse 79   Temp 98.3 F (36.8 C) (Oral)   Ht 5\' 11"  (1.803 m)   Wt (!) 317 lb (143.8 kg)   BMI 44.21 kg/m  Nursing note and vital signs reviewed.  Physical Exam  Constitutional: He is oriented to person, place, and time. He appears well-developed. No distress.  Obese male seated in the chair; pleasant.   HENT:  Mouth/Throat: Oropharynx is clear and moist.  Eyes: Conjunctivae are normal.  Neck: Neck supple.  Cardiovascular: Normal rate, regular rhythm, normal heart sounds and intact distal pulses. Exam reveals no gallop and no friction rub.  No murmur heard. Pulmonary/Chest: Effort normal and breath sounds normal. No respiratory distress. He has no wheezes. He has no rales. He exhibits no tenderness.  Abdominal: Soft. Bowel sounds are normal. There is no tenderness.  Lymphadenopathy:    He has no cervical  adenopathy.  Neurological: He is alert and oriented to person, place, and time.  Skin: Skin is warm and dry. No rash noted.  Psychiatric: He has a normal mood and affect. His behavior is normal. Judgment and thought content normal.        Assessment & Plan:   Problem List Items Addressed This Visit      Cardiovascular and Mediastinum   Essential hypertension    Blood pressure appears poorly controlled with the current medication regimen despite good adherence. Denies any current symptoms of end organ damage. Encouraged to adjust nutritional intake to decrease refined carbohydrates and sodium. Will increase amlodipine and continue current dosage of lisinopril. Recommend checking blood pressure at home  daily at differing times. Plan to follow up in 1 month or sooner if needed.       Relevant Medications   lisinopril (PRINIVIL,ZESTRIL) 10 MG tablet   amLODipine (NORVASC) 10 MG tablet     Other   HIV disease (HCC) - Primary    HIV disease not currently on medication and has been out of care for about 2 years. He has resistance presence with a K103N. Most recent viral load of 1500 and CD4 of 810. Question possible non-progresser. No evidence of opportunistic infection through history or physical exam. Will start him on Biktarvy with plan to follow up in 1 month to recheck viral load and CD4 count. Declines condoms. Immunizations appear up-to-date.      Relevant Medications   bictegravir-emtricitabine-tenofovir AF (BIKTARVY) 50-200-25 MG TABS tablet   Obesity, morbid, BMI 40.0-49.9 (HCC)       I have discontinued Chang Mertz's omeprazole, ranitidine, hydrocortisone, and amLODipine. I am also having him start on amLODipine and bictegravir-emtricitabine-tenofovir AF. Additionally, I am having him maintain his lisinopril.   Meds ordered this encounter  Medications  . lisinopril (PRINIVIL,ZESTRIL) 10 MG tablet    Sig: Take 1 tablet (10 mg total) by mouth daily.    Dispense:  30 tablet     Refill:  1    Order Specific Question:   Supervising Provider    Answer:   Judyann Munson [[id number]]  . amLODipine (NORVASC) 10 MG tablet    Sig: Take 1 tablet (10 mg total) by mouth daily.    Dispense:  30 tablet    Refill:  1    Order Specific Question:   Supervising Provider    Answer:   Judyann Munson [[id number]]  . bictegravir-emtricitabine-tenofovir AF (BIKTARVY) 50-200-25 MG TABS tablet    Sig: Take 1 tablet by mouth daily.    Dispense:  30 tablet    Refill:  1    Order Specific Question:   Supervising Provider    Answer:   Judyann Munson [[id number]]     Follow-up: Return in about 1 month (around 07/11/2018), or if symptoms worsen or fail to improve.  Jeanine Luz, FNP [facility]

## 2018-06-10 NOTE — Progress Notes (Signed)
HPI: Peter Herring is a 32 y.o. male who is here for his initial visit for his HIV.  Allergies: No Known Allergies  Vitals: Temp: 98.3 F (36.8 C) (07/09 1040) Temp Source: Oral (07/09 1040) BP: 155/105 (07/09 1040) Pulse Rate: 79 (07/09 1040)  Past Medical History: Past Medical History:  Diagnosis Date  . HIV infection (HCC)   . Hypertension     Social History: Social History   Socioeconomic History  . Marital status: Single    Spouse name: Not on file  . Number of children: Not on file  . Years of education: Not on file  . Highest education level: Not on file  Occupational History  . Occupation: Art therapist  Social Needs  . Financial resource strain: Not on file  . Food insecurity:    Worry: Not on file    Inability: Not on file  . Transportation needs:    Medical: Not on file    Non-medical: Not on file  Tobacco Use  . Smoking status: Former Smoker    Packs/day: 0.50    Years: 12.00    Pack years: 6.00    Types: Cigarettes  . Smokeless tobacco: Never Used  Substance and Sexual Activity  . Alcohol use: Never    Alcohol/week: 0.0 oz    Frequency: Never  . Drug use: Never  . Sexual activity: Not on file  Lifestyle  . Physical activity:    Days per week: Not on file    Minutes per session: Not on file  . Stress: Not on file  Relationships  . Social connections:    Talks on phone: Not on file    Gets together: Not on file    Attends religious service: Not on file    Active member of club or organization: Not on file    Attends meetings of clubs or organizations: Not on file    Relationship status: Not on file  Other Topics Concern  . Not on file  Social History Narrative  . Not on file    Previous Regimen: None  Current Regimen: None  Labs: HIV 1 RNA Quant (copies/mL)  Date Value  05/26/2018 1,520 (H)   CD4 T Cell Abs (/uL)  Date Value  05/26/2018 810   Hep B S Ab (no units)  Date Value  05/26/2018 REACTIVE (A)   Hepatitis B  Surface Ag (no units)  Date Value  05/26/2018 NON-REACTIVE    CrCl: Estimated Creatinine Clearance: 168 mL/min (by C-G formula based on SCr of 0.9 mg/dL).  Lipids:    Component Value Date/Time   CHOL 191 05/26/2018 1351   TRIG 174 (H) 05/26/2018 1351   HDL 49 05/26/2018 1351   CHOLHDL 3.9 05/26/2018 1351   LDLCALC 113 (H) 05/26/2018 1351    Assessment: Peter Herring is here for his initial visit for HIV. He has baseline K103N. After talking to him, both Tammy Sours and I feel like his potential for not 100% adherence. We were thinking [place] but his BCBS would not cover it, therefore, we are going to do [place] instead. Stressed the importance of adherence to him repeatedly. Counseled on side effects and advised to let us know if he has any. Gave him a key chain and weekly pill box today.   [facility] can fill his Rx. He would like it to be mailed to him. All copay will be taken care of.  Recommendations:  Start Biktarvy 1 PO qday F/u with Suzette Battiest, PharmD, BCPS, AAHIVP, CPP  Clinical Infectious Disease Pharmacist [facility] 06/10/2018, 11:31 AM

## 2018-06-10 NOTE — Assessment & Plan Note (Signed)
Blood pressure appears poorly controlled with the current medication regimen despite good adherence. Denies any current symptoms of end organ damage. Encouraged to adjust nutritional intake to decrease refined carbohydrates and sodium. Will increase amlodipine and continue current dosage of lisinopril. Recommend checking blood pressure at home daily at differing times. Plan to follow up in 1 month or sooner if needed.

## 2018-06-10 NOTE — Patient Instructions (Addendum)
Very nice to meet you!  We will refill your blood pressure medications for you.  Please check your blood pressure at least 1 time daily at different times. A blood pressure cuff can be obtain from [employer]. Please ensure that the cuff measures on the upper arm and not the wrist as they tend to be inaccurate.   Please plan to follow up in 1 month or sooner if needed.   Please ask any questions that you may have.   Please work on a low carbohydrate diet and be cautious with salt intake.   Consider reading the book "The Obesity Code" by Dr. Wylene SimmerJason Fung.

## 2018-07-04 ENCOUNTER — Encounter: Payer: Self-pay | Admitting: *Deleted

## 2018-07-10 NOTE — Progress Notes (Signed)
Subjective:    Patient ID: Peter Herring, male    DOB: 06/13/1986, 32 y.o.   MRN: [id number]  Chief Complaint  Patient presents with  . HIV Positive/AIDS     HPI:  Peter Herring is a 32 y.o. male who presents today for routine follow up of his HIV disease.   1.) HIV - Peter Herring was last seen in the office on 06/10/18 to establish care for his HIV disease with a viral load of 1520 and Cd4 count of 810. He was started on Biktarvy. He is due for second meningococcal vaccination.  Peter Herring reports taking his medication as prescribed with no adverse side effects. He has missed 3 doses and has no problems taking or obtaining his medication. Denies fevers, chills, night sweats, headaches, changes in vision, neck pain/stiffness, nausea, diarrhea, vomiting, lesions or rashes.  2.) Hypertension - Peter Herring has been taking his lisinopril and amlodipine as prescribed with no adverse side effects. He currently does not check his blood pressure at home. Denies headaches, changes in vision, chest pain, or shortness of breath. He has not really been working on his nutritional intake.   BP Readings from Last 3 Encounters:  07/11/18 (!) 149/98  06/10/18 (!) 155/105  04/15/18 (!) 178/103     No Known Allergies  Immunization History  Administered Date(s) Administered  . Meningococcal Mcv4o 04/17/2016  . Pneumococcal Conjugate-13 12/22/2013  . Pneumococcal Polysaccharide-23 03/24/2014  . Tdap 03/24/2014      Outpatient Medications Prior to Visit  Medication Sig Dispense Refill  . amLODipine (NORVASC) 10 MG tablet Take 1 tablet (10 mg total) by mouth daily. 30 tablet 1  . bictegravir-emtricitabine-tenofovir AF (BIKTARVY) 50-200-25 MG TABS tablet Take 1 tablet by mouth daily. 30 tablet 1  . lisinopril (PRINIVIL,ZESTRIL) 10 MG tablet Take 1 tablet (10 mg total) by mouth daily. 30 tablet 1   No facility-administered medications prior to visit.     Past Medical History:  Diagnosis Date  . HIV  infection (HCC)   . Hypertension      No past surgical history on file.   Review of Systems  Constitutional: Negative for appetite change, chills, fatigue, fever and unexpected weight change.  Eyes: Negative for visual disturbance.       Negative for changes in vision  Respiratory: Negative for cough, chest tightness, shortness of breath and wheezing.   Cardiovascular: Negative for chest pain, palpitations and leg swelling.  Gastrointestinal: Negative for abdominal pain, constipation, diarrhea, nausea and vomiting.  Genitourinary: Negative for dysuria, flank pain, frequency, genital sores, hematuria and urgency.  Skin: Negative for rash.  Allergic/Immunologic: Negative for immunocompromised state.  Neurological: Negative for dizziness, weakness, light-headedness and headaches.      Objective:    BP (!) 149/98   Pulse 82   Temp 98.5 F (36.9 C)   Wt (!) 314 lb 6.4 oz (142.6 kg)   BMI 43.85 kg/m  Nursing note and vital signs reviewed.   Physical Exam  Constitutional: He is oriented to person, place, and time. He appears well-developed. No distress.  HENT:  Mouth/Throat: Oropharynx is clear and moist.  Eyes: Conjunctivae are normal.  Neck: Neck supple.  Cardiovascular: Normal rate, regular rhythm, normal heart sounds and intact distal pulses. Exam reveals no gallop and no friction rub.  No murmur heard. Pulmonary/Chest: Effort normal and breath sounds normal. No respiratory distress. He has no wheezes. He has no rales. He exhibits no tenderness.  Abdominal: Soft. Bowel sounds are normal. There is  no tenderness.  Lymphadenopathy:    He has no cervical adenopathy.  Neurological: He is alert and oriented to person, place, and time.  Skin: Skin is warm and dry. No rash noted.  Psychiatric: He has a normal mood and affect. His behavior is normal. Judgment and thought content normal.       Assessment & Plan:   Problem List Items Addressed This Visit      Cardiovascular  and Mediastinum   Essential hypertension    Peter Herring has improved blood pressure today, however it remains elevated above goal of average less than 140/90. Discussed importance of nutrition in helping to reduce blood pressure as well as taking medications as prescribed. He has no current symptoms of end organ damage or adverse medication side effects. Continue current dose of amlodipine. Change lisinopril to lisinopril-hydrochlorothiazide. Encouraged to monitor blood pressure at home and reduce carbohydrate intake. Follow up in 2 weeks or sooner if needed for blood pressure check with nurse visit.       Relevant Medications   lisinopril-hydrochlorothiazide (PRINZIDE,ZESTORETIC) 20-12.5 MG tablet   amLODipine (NORVASC) 10 MG tablet     Other   HIV disease (HCC) - Primary    Peter Herring appears to have stable HIV disease with his current medication regimen and no adverse side effects. He has missed 3 doses over the past month. He has no signs/symptoms of opportunistic infection through history or physical exam. Menveo updated today. I will check his CD4 count and viral load today. Continue current dosage of Biktarvy. Follow up in 3 months or sooner if needed with lab work 1-2 weeks prior to appointment. Recommend nurse visit for flu vaccination in September.       Relevant Medications   bictegravir-emtricitabine-tenofovir AF (BIKTARVY) 50-200-25 MG TABS tablet   Other Relevant Orders   T-helper cell (CD4)- ([facility] clinic only)   HIV 1 RNA quant-no reflex-bld   T-helper cell (CD4)- ([facility] clinic only)   HIV 1 RNA quant-no reflex-bld       I have discontinued Peter Herring's lisinopril. I am also having him start on lisinopril-hydrochlorothiazide. Additionally, I am having him maintain his amLODipine and bictegravir-emtricitabine-tenofovir AF.   Meds ordered this encounter  Medications  . lisinopril-hydrochlorothiazide (PRINZIDE,ZESTORETIC) 20-12.5 MG tablet    Sig: Take 1 tablet by mouth  daily.    Dispense:  30 tablet    Refill:  3    Order Specific Question:   Supervising Provider    Answer:   Judyann Munson [[id number]]  . amLODipine (NORVASC) 10 MG tablet    Sig: Take 1 tablet (10 mg total) by mouth daily.    Dispense:  30 tablet    Refill:  3    Order Specific Question:   Supervising Provider    Answer:   Judyann Munson [[id number]]  . bictegravir-emtricitabine-tenofovir AF (BIKTARVY) 50-200-25 MG TABS tablet    Sig: Take 1 tablet by mouth daily.    Dispense:  30 tablet    Refill:  3    Order Specific Question:   Supervising Provider    Answer:   Judyann Munson [[id number]]     Follow-up: 2 weeks for blood pressure check and 3 months for office visit.   Marcos Eke, MSN, FNP-C Nurse Practitioner [facility] Office phone: [phone] Pager: [phone] [facility] Main number: [phone]

## 2018-07-11 ENCOUNTER — Ambulatory Visit (INDEPENDENT_AMBULATORY_CARE_PROVIDER_SITE_OTHER): Payer: BLUE CROSS/BLUE SHIELD | Admitting: Family

## 2018-07-11 ENCOUNTER — Encounter: Payer: Self-pay | Admitting: Family

## 2018-07-11 VITALS — BP 149/98 | HR 82 | Temp 98.5°F | Wt 314.4 lb

## 2018-07-11 DIAGNOSIS — I10 Essential (primary) hypertension: Secondary | ICD-10-CM | POA: Diagnosis not present

## 2018-07-11 DIAGNOSIS — B20 Human immunodeficiency virus [HIV] disease: Secondary | ICD-10-CM | POA: Diagnosis not present

## 2018-07-11 DIAGNOSIS — Z23 Encounter for immunization: Secondary | ICD-10-CM

## 2018-07-11 LAB — T-HELPER CELL (CD4) - (RCID CLINIC ONLY)
CD4 % Helper T Cell: 37 % (ref 33–55)
CD4 T Cell Abs: 960 /uL (ref 400–2700)

## 2018-07-11 MED ORDER — LISINOPRIL-HYDROCHLOROTHIAZIDE 20-12.5 MG PO TABS: 1.0000 | ORAL_TABLET | Freq: Every day | ORAL | 3 refills | Status: DC

## 2018-07-11 MED ORDER — BICTEGRAVIR-EMTRICITAB-TENOFOV 50-200-25 MG PO TABS: 1.0000 | ORAL_TABLET | Freq: Every day | ORAL | 3 refills | Status: DC

## 2018-07-11 MED ORDER — AMLODIPINE BESYLATE 10 MG PO TABS: 10.0000 mg | ORAL_TABLET | Freq: Every day | ORAL | 3 refills | Status: DC

## 2018-07-11 MED FILL — LISINOPRIL-HCTZ 20-12.5 TAB: 20-12.5 | 30 days supply | Qty: 30 | Fill #0

## 2018-07-11 MED FILL — AMLODIPINE BESYLATE 10 MG T: 10 | 30 days supply | Qty: 30 | Fill #1

## 2018-07-11 MED FILL — BIKTARVY 50-200-25 MG TABS: 50-200-25 | 30 days supply | Qty: 30 | Fill #1

## 2018-07-11 NOTE — Assessment & Plan Note (Signed)
[name] appears to have stable HIV disease with his current medication regimen and no adverse side effects. He has missed 3 doses over the past month. He has no signs/symptoms of opportunistic infection through history or physical exam. Menveo updated today. I will check his CD4 count and viral load today. Continue current dosage of Biktarvy. Follow up in 3 months or sooner if needed with lab work 1-2 weeks prior to appointment. Recommend nurse visit for flu vaccination in September.

## 2018-07-11 NOTE — Assessment & Plan Note (Signed)
[name] has improved blood pressure today, however it remains elevated above goal of average less than 140/90. Discussed importance of nutrition in helping to reduce blood pressure as well as taking medications as prescribed. He has no current symptoms of end organ damage or adverse medication side effects. Continue current dose of amlodipine. Change lisinopril to lisinopril-hydrochlorothiazide. Encouraged to monitor blood pressure at home and reduce carbohydrate intake. Follow up in 2 weeks or sooner if needed for blood pressure check with nurse visit.

## 2018-07-11 NOTE — Patient Instructions (Signed)
Nice to see you.  Continue to take your [place] Clement[place]iktarvy as prescribed.  STOP taking LISINOPRIL and START taking LISINOPRIL-HYDROCHLOROTHIAZIDE  We will check your blood work today.  Schedule a nurse visit for about 2 weeks to recheck your blood pressure.  Follow up office visit in 3 months or sooner if needed with blood work 1-2 weeks prior to appointment.

## 2018-07-14 LAB — HIV-1 RNA QUANT-NO REFLEX-BLD
HIV 1 RNA QUANT: NOT DETECTED {copies}/mL
HIV-1 RNA QUANT, LOG: NOT DETECTED {Log_copies}/mL

## 2018-08-07 MED FILL — AMLODIPINE BESYLATE 10 MG T: 10 | 30 days supply | Qty: 30 | Fill #0

## 2018-08-07 MED FILL — LISINOPRIL-HCTZ 20-12.5 TAB: 20-12.5 | 30 days supply | Qty: 30 | Fill #1

## 2018-08-07 MED FILL — BIKTARVY 50-200-25 MG TABS: 50-200-25 | 30 days supply | Qty: 30 | Fill #0

## 2018-08-11 ENCOUNTER — Emergency Department (HOSPITAL_COMMUNITY): Payer: BLUE CROSS/BLUE SHIELD

## 2018-08-11 ENCOUNTER — Encounter (HOSPITAL_COMMUNITY): Payer: Self-pay | Admitting: Emergency Medicine

## 2018-08-11 ENCOUNTER — Other Ambulatory Visit: Payer: Self-pay

## 2018-08-11 ENCOUNTER — Emergency Department (HOSPITAL_COMMUNITY)
Admission: EM | Admit: 2018-08-11 | Discharge: 2018-08-11 | Disposition: A | Discharge status: Home / Self Care | Payer: BLUE CROSS/BLUE SHIELD | Attending: Emergency Medicine | Admitting: Emergency Medicine

## 2018-08-11 DIAGNOSIS — K760 Fatty (change of) liver, not elsewhere classified: Secondary | ICD-10-CM | POA: Diagnosis not present

## 2018-08-11 DIAGNOSIS — K8020 Calculus of gallbladder without cholecystitis without obstruction: Secondary | ICD-10-CM | POA: Insufficient documentation

## 2018-08-11 DIAGNOSIS — Z87891 Personal history of nicotine dependence: Secondary | ICD-10-CM | POA: Diagnosis not present

## 2018-08-11 DIAGNOSIS — Z79899 Other long term (current) drug therapy: Secondary | ICD-10-CM | POA: Insufficient documentation

## 2018-08-11 DIAGNOSIS — I10 Essential (primary) hypertension: Secondary | ICD-10-CM | POA: Insufficient documentation

## 2018-08-11 DIAGNOSIS — Z21 Asymptomatic human immunodeficiency virus [HIV] infection status: Secondary | ICD-10-CM | POA: Diagnosis not present

## 2018-08-11 DIAGNOSIS — R112 Nausea with vomiting, unspecified: Secondary | ICD-10-CM | POA: Diagnosis not present

## 2018-08-11 DIAGNOSIS — R1011 Right upper quadrant pain: Secondary | ICD-10-CM | POA: Diagnosis not present

## 2018-08-11 DIAGNOSIS — R1084 Generalized abdominal pain: Secondary | ICD-10-CM | POA: Diagnosis not present

## 2018-08-11 DIAGNOSIS — R1010 Upper abdominal pain, unspecified: Secondary | ICD-10-CM | POA: Diagnosis not present

## 2018-08-11 LAB — URINALYSIS, ROUTINE W REFLEX MICROSCOPIC
BACTERIA UA: NONE SEEN
BILIRUBIN URINE: NEGATIVE
Glucose, UA: NEGATIVE mg/dL
Ketones, ur: NEGATIVE mg/dL
LEUKOCYTES UA: NEGATIVE
NITRITE: NEGATIVE
PROTEIN: 30 mg/dL — AB
Specific Gravity, Urine: 1.016 (ref 1.005–1.030)
pH: 6 (ref 5.0–8.0)

## 2018-08-11 LAB — COMPREHENSIVE METABOLIC PANEL
ALBUMIN: 3.7 g/dL (ref 3.5–5.0)
ALT: 42 U/L (ref 0–44)
ANION GAP: 11 (ref 5–15)
AST: 25 U/L (ref 15–41)
Alkaline Phosphatase: 91 U/L (ref 38–126)
BUN: 8 mg/dL (ref 6–20)
CHLORIDE: 103 mmol/L (ref 98–111)
CO2: 25 mmol/L (ref 22–32)
Calcium: 9.5 mg/dL (ref 8.9–10.3)
Creatinine, Ser: 0.96 mg/dL (ref 0.61–1.24)
GFR calc non Af Amer: >60 mL/min (ref >60)
GLUCOSE: 149 mg/dL — AB (ref 70–99)
Potassium: 4.3 mmol/L (ref 3.5–5.1)
SODIUM: 139 mmol/L (ref 135–145)
Total Bilirubin: 0.5 mg/dL (ref 0.3–1.2)
Total Protein: 9.2 g/dL — ABNORMAL HIGH (ref 6.5–8.1)

## 2018-08-11 LAB — CBC WITH DIFFERENTIAL/PLATELET
ABS IMMATURE GRANULOCYTES: 0 10*3/uL (ref 0.0–0.1)
BASOS PCT: 0 %
Basophils Absolute: 0 10*3/uL (ref 0.0–0.1)
EOS ABS: 0 10*3/uL (ref 0.0–0.7)
EOS PCT: 0 %
HCT: 43 % (ref 39.0–52.0)
Hemoglobin: 13.8 g/dL (ref 13.0–17.0)
Immature Granulocytes: 0 %
Lymphocytes Relative: 19 %
Lymphs Abs: 1.9 10*3/uL (ref 0.7–4.0)
MCH: 27.9 pg (ref 26.0–34.0)
MCHC: 32.1 g/dL (ref 30.0–36.0)
MCV: 86.9 fL (ref 78.0–100.0)
MONO ABS: 0.5 10*3/uL (ref 0.1–1.0)
MONOS PCT: 5 %
NEUTROS ABS: 7.7 10*3/uL (ref 1.7–7.7)
Neutrophils Relative %: 76 %
PLATELETS: 323 10*3/uL (ref 150–400)
RBC: 4.95 MIL/uL (ref 4.22–5.81)
RDW: 13.3 % (ref 11.5–15.5)
WBC: 10.1 10*3/uL (ref 4.0–10.5)

## 2018-08-11 LAB — I-STAT CG4 LACTIC ACID, ED
LACTIC ACID, VENOUS: 2.72 mmol/L — AB (ref 0.5–1.9)
Lactic Acid, Venous: 1.96 mmol/L — ABNORMAL HIGH (ref 0.5–1.9)

## 2018-08-11 LAB — LIPASE, BLOOD: LIPASE: 30 U/L (ref 11–51)

## 2018-08-11 MED ORDER — OXYCODONE HCL 5 MG PO TABS: 5.0000 mg | ORAL_TABLET | ORAL | 0 refills | Status: DC | PRN

## 2018-08-11 MED ORDER — MORPHINE SULFATE (PF) 4 MG/ML IV SOLN
4.0000 mg | Freq: Once | INTRAVENOUS | Status: AC
  Administered 2018-08-11: 4 mg via INTRAVENOUS
  Filled 2018-08-11: qty 1

## 2018-08-11 MED ORDER — SUCRALFATE 1 GM/10ML PO SUSP: 1.0000 g | Freq: Three times a day (TID) | ORAL | 0 refills | Status: DC

## 2018-08-11 MED ORDER — SODIUM CHLORIDE 0.9 % IV BOLUS
1000.0000 mL | Freq: Once | INTRAVENOUS | Status: AC
  Administered 2018-08-11: 1000 mL via INTRAVENOUS

## 2018-08-11 MED ORDER — FAMOTIDINE 20 MG PO TABS: 20.0000 mg | ORAL_TABLET | Freq: Two times a day (BID) | ORAL | 0 refills | Status: DC

## 2018-08-11 MED ORDER — IOPAMIDOL (ISOVUE-300) INJECTION 61%
100.0000 mL | Freq: Once | INTRAVENOUS | Status: AC | PRN
  Administered 2018-08-11: 100 mL via INTRAVENOUS

## 2018-08-11 MED ORDER — IOPAMIDOL (ISOVUE-300) INJECTION 61%
INTRAVENOUS | Status: AC
  Filled 2018-08-11: qty 100

## 2018-08-11 MED ORDER — ONDANSETRON HCL 4 MG/2ML IJ SOLN
4.0000 mg | Freq: Once | INTRAMUSCULAR | Status: AC
  Administered 2018-08-11: 4 mg via INTRAVENOUS
  Filled 2018-08-11: qty 2

## 2018-08-11 MED ORDER — ONDANSETRON 4 MG PO TBDP: ORAL_TABLET | ORAL | 0 refills | Status: DC

## 2018-08-11 MED ORDER — FAMOTIDINE IN NACL 20-0.9 MG/50ML-% IV SOLN
20.0000 mg | Freq: Once | INTRAVENOUS | Status: AC
  Administered 2018-08-11: 20 mg via INTRAVENOUS
  Filled 2018-08-11: qty 50

## 2018-08-11 MED ORDER — GI COCKTAIL ~~LOC~~
30.0000 mL | Freq: Once | ORAL | Status: AC
  Administered 2018-08-11: 30 mL via ORAL
  Filled 2018-08-11: qty 30

## 2018-08-11 MED ORDER — SODIUM CHLORIDE 0.9 % IV BOLUS
500.0000 mL | Freq: Once | INTRAVENOUS | Status: AC
  Administered 2018-08-11: 500 mL via INTRAVENOUS

## 2018-08-11 NOTE — ED Notes (Signed)
Attempted IV x2. 

## 2018-08-11 NOTE — ED Triage Notes (Signed)
Pt arrives via POV from home with generalized abdominal pain. PT reports nausea/vomiting. Reports subjective fever since waking this AM. Reports HIV diagnosis. Compliant with meds. Viral load undectable per patient. Diaphoretic in triage. Denies recent cough, infection.

## 2018-08-11 NOTE — ED Notes (Signed)
Pt returned from CT °

## 2018-08-11 NOTE — ED Notes (Signed)
Spoke to CT, stated he would be next.

## 2018-08-11 NOTE — ED Notes (Signed)
MD notified of elevated lactic acid result 

## 2018-08-11 NOTE — Discharge Instructions (Addendum)
Your lab work is overall extremely reassuring today.  Your symptoms are likely due to gallstones, but there was no evidence of an acute inflammation or infection of your gallbladder today.  When you have gallstones this can cause intermittent symptoms, if this occurs again you can use nausea and pain medicine as prescribed.  Your symptoms could also be caused by some inflammation of your stomach lining you can take Pepcid twice daily and use Carafate as needed for breakthrough pain if this helps with your symptoms.   If you continue to have symptoms you will need to follow-up with general surgery so they can monitor your gallbladder and decide whether or not an elective gallbladder removal surgery would be necessary.  Return to the emergency department for significantly worsened abdominal pain, fevers, persistent vomiting, blood in your vomit or stools or any other new or concerning symptoms.

## 2018-08-11 NOTE — ED Notes (Signed)
Patient back from [facility].

## 2018-08-11 NOTE — ED Notes (Signed)
Patient transported to CT 

## 2018-08-11 NOTE — ED Provider Notes (Signed)
[facility] EMERGENCY DEPARTMENT Provider Note   CSN: [id number] Arrival date & time: 08/11/18  1122     History   Chief Complaint Chief Complaint  Patient presents with  . Abdominal Pain    HPI Peter Herring is a 32 y.o. male.  Peter Herring is a 32 y.o. Male history of HIV, hypertension and obesity, who presents to the emergency department for evaluation of sudden onset upper abdominal pain.  He reports symptoms started this morning suddenly when patient began to have pain across his upper abdomen with associated nausea and 2 episodes of non-bloody emesis.  He reports subjective fevers and chills since waking up.  No diarrhea, constipation, melena or hematochezia.  No chest pain or shortness of breath, no coughing.  No history of prior abdominal surgeries.  No dysuria or urinary frequency.  Patient reports he has not missed any doses of his HIV medications and at his last infectious disease follow-up appointment his viral load was undetectable.  No recent infections or illness.  No known sick contacts.     Past Medical History:  Diagnosis Date  . HIV infection (HCC)   . Hypertension     Patient Active Problem List   Diagnosis Date Noted  . HIV disease (HCC) 06/10/2018  . Essential hypertension 06/10/2018  . Obesity, morbid, BMI 40.0-49.9 (HCC) 06/10/2018    History reviewed. No pertinent surgical history.      Home Medications    Prior to Admission medications   Medication Sig Start Date End Date Taking? Authorizing Provider  amLODipine (NORVASC) 10 MG tablet Take 1 tablet (10 mg total) by mouth daily. 07/11/18  Yes Veryl Speak, FNP  bictegravir-emtricitabine-tenofovir AF (BIKTARVY) 50-200-25 MG TABS tablet Take 1 tablet by mouth daily. 07/11/18  Yes Veryl Speak, FNP  lisinopril-hydrochlorothiazide (PRINZIDE,ZESTORETIC) 20-12.5 MG tablet Take 1 tablet by mouth daily. 07/11/18  Yes Veryl Speak, FNP  famotidine (PEPCID) 20 MG tablet Take 1  tablet (20 mg total) by mouth 2 (two) times daily. 08/11/18   Dartha Lodge, PA-C  ondansetron (ZOFRAN ODT) 4 MG disintegrating tablet 4mg  ODT q4 hours prn nausea/vomit 08/11/18   Dartha Lodge, PA-C  oxyCODONE (ROXICODONE) 5 MG immediate release tablet Take 1 tablet (5 mg total) by mouth every 4 (four) hours as needed for severe pain. 08/11/18   Dartha Lodge, PA-C  sucralfate (CARAFATE) 1 GM/10ML suspension Take 10 mLs (1 g total) by mouth 4 (four) times daily -  with meals and at bedtime. 08/11/18   Dartha Lodge, PA-C    Family History Family History  Problem Relation Age of Onset  . Cancer Mother   . Diabetes Mother   . Diabetes Father   . Heart disease Father   . Diabetes Sister     Social History Social History   Tobacco Use  . Smoking status: Former Smoker    Packs/day: 0.50    Years: 12.00    Pack years: 6.00    Types: Cigarettes  . Smokeless tobacco: Never Used  Substance Use Topics  . Alcohol use: Never    Alcohol/week: 0.0 standard drinks    Frequency: Never  . Drug use: Never     Allergies   Patient has no known allergies.   Review of Systems Review of Systems  Constitutional: Positive for chills and fever.  HENT: Negative.   Eyes: Negative for visual disturbance.  Respiratory: Negative for cough and shortness of breath.   Cardiovascular: Negative for chest  pain, palpitations and leg swelling.  Gastrointestinal: Positive for abdominal pain, nausea and vomiting. Negative for blood in stool, constipation and diarrhea.  Genitourinary: Negative for dysuria, flank pain, frequency and hematuria.  Musculoskeletal: Negative for arthralgias, back pain and myalgias.  Skin: Negative for color change and rash.  Neurological: Negative for dizziness, syncope, light-headedness and headaches.     Physical Exam Updated Vital Signs BP (!) 148/132 (BP Location: Left Arm)   Pulse 72   Temp 98 F (36.7 C) (Oral)   Resp (!) 22   Ht 5\' 11"  (1.803 m)   Wt (!) 142.9 kg    SpO2 99%   BMI 43.93 kg/m   Physical Exam  Constitutional: He is oriented to person, place, and time. He appears well-developed and well-nourished. No distress.  HENT:  Head: Normocephalic and atraumatic.  Mouth/Throat: Oropharynx is clear and moist.  Eyes: Pupils are equal, round, and reactive to light. EOM are normal. Right eye exhibits no discharge. Left eye exhibits no discharge.  Neck: Neck supple.  Cardiovascular: Normal rate, regular rhythm, normal heart sounds and intact distal pulses.  Pulmonary/Chest: Effort normal and breath sounds normal. No respiratory distress.  Respirations equal and unlabored, patient able to speak in full sentences, lungs clear to auscultation bilaterally  Abdominal: Soft. Normal appearance and bowel sounds are normal. He exhibits no distension. There is tenderness in the right upper quadrant, epigastric area and left upper quadrant. There is guarding. There is no rigidity, no rebound and no CVA tenderness.  Abdomen soft, nondistended, bowel sounds present throughout, there is tenderness across the upper abdomen with guarding, no lower abdominal tenderness or guarding, no CVA tenderness bilaterally  Musculoskeletal: He exhibits no edema or deformity.  Neurological: He is alert and oriented to person, place, and time. Coordination normal.  Skin: Skin is warm and dry. Capillary refill takes less than 2 seconds. He is not diaphoretic.  Psychiatric: He has a normal mood and affect. His behavior is normal.  Nursing note and vitals reviewed.    ED Treatments / Results  Labs (all labs ordered are listed, but only abnormal results are displayed) Labs Reviewed  COMPREHENSIVE METABOLIC PANEL - Abnormal; Notable for the following components:      Result Value   Glucose, Bld 149 (*)    Total Protein 9.2 (*)    All other components within normal limits  URINALYSIS, ROUTINE W REFLEX MICROSCOPIC - Abnormal; Notable for the following components:   Color, Urine  STRAW (*)    Hgb urine dipstick SMALL (*)    Protein, ur 30 (*)    All other components within normal limits  I-STAT CG4 LACTIC ACID, ED - Abnormal; Notable for the following components:   Lactic Acid, Venous 2.72 (*)    All other components within normal limits  I-STAT CG4 LACTIC ACID, ED - Abnormal; Notable for the following components:   Lactic Acid, Venous 1.96 (*)    All other components within normal limits  LIPASE, BLOOD  CBC WITH DIFFERENTIAL/PLATELET    EKG EKG Interpretation  Date/Time:  Monday August 11 2018 11:47:32 EDT Ventricular Rate:  73 PR Interval:  182 QRS Duration: 92 QT Interval:  408 QTC Calculation: 449 R Axis:   9 Text Interpretation:  Normal sinus rhythm Minimal voltage criteria for LVH, may be normal variant Borderline ECG When compared to prior, no significant changes seen.  No STEMI Confirmed by Theda Belfast ([id number]) on 08/11/2018 12:40:09 PM   Radiology Ct Abdomen Pelvis W Contrast  Result  Date: 08/11/2018 CLINICAL DATA:  Generalized abdominal pain nausea and vomiting EXAM: CT ABDOMEN AND PELVIS WITH CONTRAST TECHNIQUE: Multidetector CT imaging of the abdomen and pelvis was performed using the standard protocol following bolus administration of intravenous contrast. CONTRAST:  ISOVUE-300 IOPAMIDOL (ISOVUE-300) INJECTION 61% COMPARISON:  None. FINDINGS: Lower chest: Lung bases show no acute consolidation or effusion. Borderline to mild cardiomegaly. Hepatobiliary: No focal liver abnormality is seen. No gallstones, gallbladder wall thickening, or biliary dilatation. Pancreas: Unremarkable. No pancreatic ductal dilatation or surrounding inflammatory changes. Spleen: Normal in size without focal abnormality. Adrenals/Urinary Tract: Adrenal glands are unremarkable. Kidneys are normal, without renal calculi, focal lesion, or hydronephrosis. Bladder is unremarkable. Stomach/Bowel: Stomach is within normal limits. Appendix appears normal. No evidence of bowel  wall thickening, distention, or inflammatory changes. Vascular/Lymphatic: No significant vascular findings are present. No enlarged abdominal or pelvic lymph nodes. Reproductive: Prostate is unremarkable. Other: Negative for free air or free fluid. Fat in the umbilical region. Musculoskeletal: No acute or significant osseous findings. IMPRESSION: No CT evidence for acute intra-abdominal or pelvic abnormality. Electronically Signed   By: Jasmine Pang M.D.   On: 08/11/2018 17:14   US Abdomen Limited Ruq  Result Date: 08/11/2018 CLINICAL DATA:  Right upper quadrant pain. EXAM: ULTRASOUND ABDOMEN LIMITED RIGHT UPPER QUADRANT COMPARISON:  Body CT 08/11/2018 FINDINGS: Gallbladder: Shadowing calculi and sludge are seen in the dependent portion of the gallbladder. The largest calculus measures 6 mm. The gallbladder wall measures 2.8 mm. No sonographic Murphy sign noted by sonographer. Common bile duct: Diameter: 2.5 mm Liver: No focal lesion identified. Mildly increased parenchymal echogenicity. Portal vein is patent on color Doppler imaging with normal direction of blood flow towards the liver. IMPRESSION: Cholelithiasis without sonographic evidence of acute cholecystitis. Mild hepatic steatosis. Electronically Signed   By: Ted Mcalpine M.D.   On: 08/11/2018 18:17    Procedures Procedures (including critical care time)  Medications Ordered in ED Medications  ondansetron (ZOFRAN) injection 4 mg (4 mg Intravenous Given 08/11/18 1326)  sodium chloride 0.9 % bolus 1,000 mL (0 mLs Intravenous Stopped 08/11/18 1515)  morphine 4 MG/ML injection 4 mg (4 mg Intravenous Given 08/11/18 1326)  famotidine (PEPCID) IVPB 20 mg premix (0 mg Intravenous Stopped 08/11/18 1407)  gi cocktail (Maalox,Lidocaine,Donnatal) (30 mLs Oral Given 08/11/18 1603)  morphine 4 MG/ML injection 4 mg (4 mg Intravenous Given 08/11/18 1643)  ondansetron (ZOFRAN) injection 4 mg (4 mg Intravenous Given 08/11/18 1643)  sodium chloride 0.9 % bolus 500  mL (0 mLs Intravenous Stopped 08/11/18 1925)  iopamidol (ISOVUE-300) 61 % injection 100 mL (100 mLs Intravenous Contrast Given 08/11/18 1624)     Initial Impression / Assessment and Plan / ED Course  I have reviewed the triage vital signs and the nursing notes.  Pertinent labs & imaging results that were available during my care of the patient were reviewed by me and considered in my medical decision making (see chart for details).  Patient presents with acute onset upper abdominal pain that started this morning with associated nausea, vomiting and subjective fevers and chills.  History of HIV but compliant with medications with undetectable viral load.  Has not had any recent infections or illnesses.  Hypertensive on arrival, which patient has history of but vitals otherwise stable, afebrile without tachycardia.  Tenderness across the upper abdomen with guarding, no other focal findings on exam.  No active vomiting but patient continues to endorse nausea.  Concern for gastritis, PUD, pancreatitis or gallbladder disease.  Patient denies  recent alcohol use.  No history of prior abdominal surgeries.  Abdominal labs and lactic acid obtained from triage will get CT abdomen pelvis.   1 L IV fluid bolus, morphine, Zofran and Pepcid for symptomatic management.  Notified by lab of lactic acid of 2.72, patient does not meet sepsis criteria at this time will hold off on initiating any additional orders.  This could be due to dehydration and will hopefully improve with fluids.  Labs overall extremely reassuring there is no leukocytosis and normal hemoglobin, no acute electrolyte derangements aside from glucose of 149, normal renal function, normal liver function normal lipase.  No anion gap.  Urinalysis with small amount of protein but no signs of infection.  Repeat lactic collected after 500 mL of fluid and already improving to 1.96.  Patient initially had improvement in pain but reports it feels like it is  returning will give additional fluid bolus, morphine and Zofran as well as GI cocktail.  CT abdomen pelvis shows no acute findings.  Given the patient does have upper abdominal tenderness and tenderness in the right upper quadrant will get ultrasound to rule out cholecystitis.  Ultrasound shows gallstones without any evidence of cholecystitis, patient does not have any elevation in LFTs or leukocytosis.  Suspect patient is experiencing symptomatic cholelithiasis, could also be experiencing some gastritis or PUD.  Pain has significantly improved and patient is resting comfortably, no active vomiting and tolerating p.o.  Will discharge with pain nausea medication, will also start patient on H2 blocker, Carafate as needed if this helps with symptoms.  Will have patient follow-up with general surgery for continued monitoring of cholelithiasis, if patient continues to be symptomatic may need elective cholecystectomy.  Discussed plan with patient and partner.  Discussed return precautions.  They expressed understanding and are in agreement with plan.  Able for discharge home at this time.   Final Clinical Impressions(s) / ED Diagnoses   Final diagnoses:  RUQ pain  Symptomatic cholelithiasis  Non-intractable vomiting with nausea, unspecified vomiting type    ED Discharge Orders         Ordered    famotidine (PEPCID) 20 MG tablet  2 times daily     08/11/18 1903    sucralfate (CARAFATE) 1 GM/10ML suspension  3 times daily with meals & bedtime     08/11/18 1903    oxyCODONE (ROXICODONE) 5 MG immediate release tablet  Every 4 hours PRN     08/11/18 1903    ondansetron (ZOFRAN ODT) 4 MG disintegrating tablet     08/11/18 1903           Dartha Lodge, [place] 08/12/18 1152    Tegeler, Canary Brim, MD 08/12/18 2240

## 2018-08-11 NOTE — ED Notes (Signed)
Patient verbalizes understanding of discharge instructions. Opportunity for questioning and answers were provided. Armband removed by staff, pt discharged from ED.  

## 2018-08-11 NOTE — ED Notes (Signed)
Spoke to CT about delay in imaging, they will send for transport.

## 2018-08-11 NOTE — ED Notes (Signed)
Patient vomited after GI cocktail was administered.

## 2018-09-03 MED FILL — AMLODIPINE BESYLATE 10 MG T: 10 | 30 days supply | Qty: 30 | Fill #1

## 2018-09-03 MED FILL — LISINOPRIL-HCTZ 20-12.5 TAB: 20-12.5 | 30 days supply | Qty: 30 | Fill #2

## 2018-09-03 MED FILL — BIKTARVY 50-200-25 MG TABS: 50-200-25 | 30 days supply | Qty: 30 | Fill #1

## 2018-09-22 ENCOUNTER — Other Ambulatory Visit: Payer: BLUE CROSS/BLUE SHIELD

## 2018-09-22 DIAGNOSIS — B20 Human immunodeficiency virus [HIV] disease: Secondary | ICD-10-CM | POA: Diagnosis not present

## 2018-09-23 LAB — T-HELPER CELL (CD4) - (RCID CLINIC ONLY)
CD4 % Helper T Cell: 41 % (ref 33–55)
CD4 T CELL ABS: 1052 /uL (ref 400–2700)

## 2018-09-24 LAB — HIV-1 RNA QUANT-NO REFLEX-BLD
HIV 1 RNA Quant: <20 copies/mL
HIV-1 RNA QUANT, LOG: NOT DETECTED {Log_copies}/mL

## 2018-10-06 ENCOUNTER — Encounter: Payer: BLUE CROSS/BLUE SHIELD | Admitting: Family

## 2018-10-06 NOTE — Progress Notes (Deleted)
   Subjective:    Patient ID: Peter Herring, male    DOB: 21-Oct-1986, 32 y.o.   MRN: [id number]  No chief complaint on file.    HPI:  Peter Herring is a 32 y.o. male who presents today for routine follow up of his HIV disease.  Mr. Batzel was last seen in the office on 07/11/18 for routine follow up with his antiretroviral regimen of Biktarvy. He was undetectable at this time with a CD4 count of 960. Most recent blood work completed on 10/21 shows continued viral control as he remains undetectable with a viral load of 1,052.    No Known Allergies    Outpatient Medications Prior to Visit  Medication Sig Dispense Refill  . amLODipine (NORVASC) 10 MG tablet Take 1 tablet (10 mg total) by mouth daily. 30 tablet 3  . bictegravir-emtricitabine-tenofovir AF (BIKTARVY) 50-200-25 MG TABS tablet Take 1 tablet by mouth daily. 30 tablet 3  . famotidine (PEPCID) 20 MG tablet Take 1 tablet (20 mg total) by mouth 2 (two) times daily. 30 tablet 0  . lisinopril-hydrochlorothiazide (PRINZIDE,ZESTORETIC) 20-12.5 MG tablet Take 1 tablet by mouth daily. 30 tablet 3  . ondansetron (ZOFRAN ODT) 4 MG disintegrating tablet 4mg  ODT q4 hours prn nausea/vomit 10 tablet 0  . oxyCODONE (ROXICODONE) 5 MG immediate release tablet Take 1 tablet (5 mg total) by mouth every 4 (four) hours as needed for severe pain. 15 tablet 0  . sucralfate (CARAFATE) 1 GM/10ML suspension Take 10 mLs (1 g total) by mouth 4 (four) times daily -  with meals and at bedtime. 420 mL 0   No facility-administered medications prior to visit.      Past Medical History:  Diagnosis Date  . HIV infection (HCC)   . Hypertension      No past surgical history on file.     Review of Systems    Objective:    There were no vitals taken for this visit. Nursing note and vital signs reviewed.  Physical Exam     Assessment & Plan:   Problem List Items Addressed This Visit    None       I am having Peter Herring maintain his  lisinopril-hydrochlorothiazide, amLODipine, bictegravir-emtricitabine-tenofovir AF, famotidine, sucralfate, oxyCODONE, and ondansetron.   No orders of the defined types were placed in this encounter.    Follow-up: No follow-ups on file.   Peter Eke, MSN, FNP-C Nurse Practitioner [facility] Office phone: [phone] Pager: [phone] [facility] Main number: [phone]

## 2018-10-10 MED FILL — BIKTARVY 50-200-25 MG TABS: 50-200-25 | 30 days supply | Qty: 30 | Fill #2

## 2018-10-10 MED FILL — LISINOPRIL-HCTZ 20-12.5 TAB: 20-12.5 | 30 days supply | Qty: 30 | Fill #3

## 2018-10-10 MED FILL — AMLODIPINE BESYLATE 10 MG T: 10 | 30 days supply | Qty: 30 | Fill #2

## 2018-11-10 ENCOUNTER — Other Ambulatory Visit: Payer: Self-pay | Admitting: Family

## 2018-11-10 DIAGNOSIS — I10 Essential (primary) hypertension: Secondary | ICD-10-CM

## 2018-12-01 MED FILL — BIKTARVY 50-200-25 MG TABS: 50-200-25 | 30 days supply | Qty: 30 | Fill #3

## 2018-12-01 MED FILL — LISINOPRIL-HCTZ 20-12.5 MG: 20-12.5 | 30 days supply | Qty: 30 | Fill #0

## 2018-12-01 MED FILL — AMLODIPINE BESYLATE 10 MG T: 10 | 30 days supply | Qty: 30 | Fill #3

## 2018-12-25 ENCOUNTER — Encounter: Payer: Self-pay | Admitting: Family

## 2018-12-25 ENCOUNTER — Ambulatory Visit: Payer: BLUE CROSS/BLUE SHIELD | Admitting: Family

## 2018-12-25 VITALS — BP 153/97 | HR 88 | Temp 98.8°F | Wt 320.0 lb

## 2018-12-25 DIAGNOSIS — I10 Essential (primary) hypertension: Secondary | ICD-10-CM

## 2018-12-25 DIAGNOSIS — Z0000 Encounter for general adult medical examination without abnormal findings: Secondary | ICD-10-CM

## 2018-12-25 DIAGNOSIS — B20 Human immunodeficiency virus [HIV] disease: Secondary | ICD-10-CM | POA: Diagnosis not present

## 2018-12-25 MED ORDER — LISINOPRIL-HYDROCHLOROTHIAZIDE 20-12.5 MG PO TABS: 1.0000 | ORAL_TABLET | Freq: Every day | ORAL | 0 refills | Status: DC

## 2018-12-25 MED ORDER — AMLODIPINE BESYLATE 10 MG PO TABS: 10.0000 mg | ORAL_TABLET | Freq: Every day | ORAL | 3 refills | Status: DC

## 2018-12-25 MED ORDER — BICTEGRAVIR-EMTRICITAB-TENOFOV 50-200-25 MG PO TABS: 1.0000 | ORAL_TABLET | Freq: Every day | ORAL | 3 refills | Status: DC

## 2018-12-25 NOTE — Assessment & Plan Note (Addendum)
Peter Herring has previously well controlled HIV disease with good adherence and tolerance to Biktarvy. No signs/symptoms of opportunistic infection or progressive HIV disease.  Continue current dose of Biktarvy.  Plan for office follow-up in 3 months or sooner if needed for HIV with lab work 1 to 2 weeks prior to appointment.

## 2018-12-25 NOTE — Patient Instructions (Signed)
Nice to see you.  We will check your blood work today.  Continue to take your [place] as prescribed daily.  Check out :  The Obesity Code by Dr. Wylene Simmer  [email or web address]  We will plan to follow up in 3 months or sooner if needed.  Have a great day!

## 2018-12-25 NOTE — Assessment & Plan Note (Signed)
Blood pressure remains labile and above goal 140/90 with current medication regimen and no adverse side effects or symptoms of end organ damage or hypertensive urgency.  Discussed importance of nutrition and physical activity on blood pressure.  Continue current dose of lisinopril-hydrochlorothiazide and amlodipine.  Information provided in after visit summary.  Continue to monitor blood pressures at home as able.  Follow-up in 3 months or sooner if needed.

## 2018-12-25 NOTE — Progress Notes (Signed)
Subjective:    Patient ID: Peter Herring, male    DOB: 1986-01-08, 33 y.o.   MRN: [id number]  Chief Complaint  Patient presents with  . Follow-up     HPI:  Peter Herring is a 33 y.o. male who presents today for routine follow up of HIV disease.   1) HIV - Peter Herring was last seen in the office on 07/11/18 for routine follow up of HIV disease with good adherence and tolerance of his ART regimen of Biktarvy. His CD4 count was 1,052 with a viral load that was undetectable. There is no recent blood work completed to review. Health maintenance is all up to date.  Peter Herring has been taking his Biktarvy as prescribed with no adverse side effects or missed doses. Continues to be covered by [employer] receiving his medication from [facility]. He is working on independently finding a Education officer, community for routine care.   Denies fevers, chills, night sweats, headaches, changes in vision, neck pain/stiffness, nausea, diarrhea, vomiting, lesions or rashes.  2.) Hypertension - Peter Herring has been taking his amlodipine and lisinopril-hydrochlorothiazide with no adverse side effects or missed doses. Has not been checking his blood pressures at home. Denies headaches, changes in vision, chest pain or shortness of breath. Nutritionally continues to eat primarily fast foods.   BP Readings from Last 3 Encounters:  12/25/18 (!) 153/97  08/11/18 (!) 151/87  07/11/18 (!) 149/98     No Known Allergies    Outpatient Medications Prior to Visit  Medication Sig Dispense Refill  . amLODipine (NORVASC) 10 MG tablet Take 1 tablet (10 mg total) by mouth daily. 30 tablet 3  . bictegravir-emtricitabine-tenofovir AF (BIKTARVY) 50-200-25 MG TABS tablet Take 1 tablet by mouth daily. 30 tablet 3  . lisinopril-hydrochlorothiazide (PRINZIDE,ZESTORETIC) 20-12.5 MG tablet TAKE 1 TABLET BY MOUTH DAILY. 30 tablet 0  . famotidine (PEPCID) 20 MG tablet Take 1 tablet (20 mg total) by mouth 2 (two) times  daily. 30 tablet 0  . ondansetron (ZOFRAN ODT) 4 MG disintegrating tablet 4mg  ODT q4 hours prn nausea/vomit 10 tablet 0  . oxyCODONE (ROXICODONE) 5 MG immediate release tablet Take 1 tablet (5 mg total) by mouth every 4 (four) hours as needed for severe pain. 15 tablet 0  . sucralfate (CARAFATE) 1 GM/10ML suspension Take 10 mLs (1 g total) by mouth 4 (four) times daily -  with meals and at bedtime. 420 mL 0   No facility-administered medications prior to visit.      Past Medical History:  Diagnosis Date  . HIV infection (HCC)   . Hypertension      History reviewed. No pertinent surgical history.   Review of Systems  Constitutional: Negative for appetite change, chills, fatigue, fever and unexpected weight change.  Eyes: Negative for visual disturbance.       Negative for changes in vision  Respiratory: Negative for cough, chest tightness, shortness of breath and wheezing.   Cardiovascular: Negative for chest pain, palpitations and leg swelling.  Gastrointestinal: Negative for abdominal pain, constipation, diarrhea, nausea and vomiting.  Genitourinary: Negative for dysuria, flank pain, frequency, genital sores, hematuria and urgency.  Skin: Negative for rash.  Allergic/Immunologic: Negative for immunocompromised state.  Neurological: Negative for dizziness, weakness, light-headedness and headaches.      Objective:    BP (!) 153/97   Pulse 88   Temp 98.8 F (37.1 C) (Oral)   Wt (!) 320 lb (145.2 kg)   BMI 44.63 kg/m  Nursing note  and vital signs reviewed.  Physical Exam Constitutional:      General: He is not in acute distress.    Appearance: He is well-developed.  Eyes:     Conjunctiva/sclera: Conjunctivae normal.  Neck:     Musculoskeletal: Neck supple.  Cardiovascular:     Rate and Rhythm: Normal rate and regular rhythm.     Heart sounds: Normal heart sounds. No murmur. No friction rub. No gallop.   Pulmonary:     Effort: Pulmonary effort is normal. No  respiratory distress.     Breath sounds: Normal breath sounds. No wheezing or rales.  Chest:     Chest wall: No tenderness.  Abdominal:     General: Bowel sounds are normal.     Palpations: Abdomen is soft.     Tenderness: There is no abdominal tenderness.  Lymphadenopathy:     Cervical: No cervical adenopathy.  Skin:    General: Skin is warm and dry.     Findings: No rash.  Neurological:     Mental Status: He is alert and oriented to person, place, and time.  Psychiatric:        Behavior: Behavior normal.        Thought Content: Thought content normal.        Judgment: Judgment normal.        Assessment & Plan:   Problem List Items Addressed This Visit      Cardiovascular and Mediastinum   Essential hypertension    Blood pressure remains labile and above goal 140/90 with current medication regimen and no adverse side effects or symptoms of end organ damage or hypertensive urgency.  Discussed importance of nutrition and physical activity on blood pressure.  Continue current dose of lisinopril-hydrochlorothiazide and amlodipine.  Information provided in after visit summary.  Continue to monitor blood pressures at home as able.  Follow-up in 3 months or sooner if needed.      Relevant Medications   amLODipine (NORVASC) 10 MG tablet   lisinopril-hydrochlorothiazide (PRINZIDE,ZESTORETIC) 20-12.5 MG tablet     Other   HIV disease (HCC) - Primary    Peter Herring has previously well controlled HIV disease with good adherence and tolerance to Biktarvy. No signs/symptoms of opportunistic infection or progressive HIV disease.  Continue current dose of Biktarvy.  Plan for office follow-up in 3 months or sooner if needed for HIV with lab work 1 to 2 weeks prior to appointment.      Relevant Medications   bictegravir-emtricitabine-tenofovir AF (BIKTARVY) 50-200-25 MG TABS tablet   Other Relevant Orders   COMPLETE METABOLIC PANEL WITH GFR   CBC   HIV-1 RNA quant-no reflex-bld   T-helper  cell (CD4)- ([facility] clinic only)   RPR   Lipid panel   Obesity, morbid, BMI 40.0-49.9 (HCC)    Peter Herring has a BMI of 44 indicating morbid obesity.  Recommend weight loss of 5 to 10% of current body weight through nutrition and physical activity.  Discussed importance of discussed continue risk for cardiovascular, respiratory, and malignant diseases with obesity.  If unsuccessful may consider medications and or possible bariatric referral.      Healthcare maintenance     Declines vaccinations today.  Seeking dental screening independently  Discussed importance of safe sexual practice to reduce risk of acquisition and transmission of STI.  Declines condoms today.          I have discontinued Peter Herring's famotidine, sucralfate, oxyCODONE, and ondansetron. I am also having him maintain his bictegravir-emtricitabine-tenofovir AF,  amLODipine, and lisinopril-hydrochlorothiazide.   Meds ordered this encounter  Medications  . bictegravir-emtricitabine-tenofovir AF (BIKTARVY) 50-200-25 MG TABS tablet    Sig: Take 1 tablet by mouth daily.    Dispense:  30 tablet    Refill:  3    Order Specific Question:   Supervising Provider    Answer:   Judyann MunsonSNIDER, CYNTHIA [[id number]]  . amLODipine (NORVASC) 10 MG tablet    Sig: Take 1 tablet (10 mg total) by mouth daily.    Dispense:  30 tablet    Refill:  3    Order Specific Question:   Supervising Provider    Answer:   Judyann MunsonSNIDER, CYNTHIA [[id number]]  . lisinopril-hydrochlorothiazide (PRINZIDE,ZESTORETIC) 20-12.5 MG tablet    Sig: Take 1 tablet by mouth daily.    Dispense:  30 tablet    Refill:  0    Call office to schedule an appointment with our office [phone]    Order Specific Question:   Supervising Provider    Answer:   Judyann MunsonSNIDER, CYNTHIA [[id number]]     Follow-up: Return in about 3 months (around 03/26/2019), or if symptoms worsen or fail to improve.   Marcos EkeGreg Calone, MSN, FNP-C Nurse Practitioner [facility] Office phone: [phone] Pager: [phone] [facility] Main number: [phone]

## 2018-12-25 NOTE — Assessment & Plan Note (Signed)
[name] has a BMI of 44 indicating morbid obesity.  Recommend weight loss of 5 to 10% of current body weight through nutrition and physical activity.  Discussed importance of discussed continue risk for cardiovascular, respiratory, and malignant diseases with obesity.  If unsuccessful may consider medications and or possible bariatric referral.

## 2018-12-25 NOTE — Assessment & Plan Note (Signed)
   Declines vaccinations today.  Seeking dental screening independently  Discussed importance of safe sexual practice to reduce risk of acquisition and transmission of STI.  Declines condoms today.

## 2018-12-26 LAB — T-HELPER CELL (CD4) - (RCID CLINIC ONLY)
CD4 % Helper T Cell: 40 % (ref 33–55)
CD4 T Cell Abs: 1080 /uL (ref 400–2700)

## 2018-12-30 LAB — COMPLETE METABOLIC PANEL WITH GFR
AG Ratio: 1 (calc) (ref 1.0–2.5)
ALBUMIN MSPROF: 4.1 g/dL (ref 3.6–5.1)
ALKALINE PHOSPHATASE (APISO): 73 U/L (ref 40–115)
ALT: 29 U/L (ref 9–46)
AST: 18 U/L (ref 10–40)
BILIRUBIN TOTAL: 0.3 mg/dL (ref 0.2–1.2)
BUN: 7 mg/dL (ref 7–25)
CHLORIDE: 104 mmol/L (ref 98–110)
CO2: 23 mmol/L (ref 20–32)
Calcium: 9.3 mg/dL (ref 8.6–10.3)
Creat: 0.83 mg/dL (ref 0.60–1.35)
GFR, Est African American: 132 mL/min/{1.73_m2} (ref >=60)
GFR, Est Non African American: 114 mL/min/{1.73_m2} (ref >=60)
GLOBULIN: 4.1 g/dL — AB (ref 1.9–3.7)
GLUCOSE: 114 mg/dL — AB (ref 65–99)
Potassium: 4.2 mmol/L (ref 3.5–5.3)
SODIUM: 138 mmol/L (ref 135–146)
Total Protein: 8.2 g/dL — ABNORMAL HIGH (ref 6.1–8.1)

## 2018-12-30 LAB — CBC
HCT: 38.7 % (ref 38.5–50.0)
Hemoglobin: 13.4 g/dL (ref 13.2–17.1)
MCH: 28.4 pg (ref 27.0–33.0)
MCHC: 34.6 g/dL (ref 32.0–36.0)
MCV: 82 fL (ref 80.0–100.0)
MPV: 10.2 fL (ref 7.5–12.5)
Platelets: 356 10*3/uL (ref 140–400)
RBC: 4.72 10*6/uL (ref 4.20–5.80)
RDW: 12.7 % (ref 11.0–15.0)
WBC: 8 10*3/uL (ref 3.8–10.8)

## 2018-12-30 LAB — LIPID PANEL
CHOLESTEROL: 177 mg/dL (ref <200)
HDL: 47 mg/dL (ref >40)
LDL Cholesterol (Calc): 107 mg/dL (calc) — ABNORMAL HIGH
NON-HDL CHOLESTEROL (CALC): 130 mg/dL — AB (ref <130)
Total CHOL/HDL Ratio: 3.8 (calc) (ref <5.0)
Triglycerides: 121 mg/dL (ref <150)

## 2018-12-30 LAB — HIV-1 RNA QUANT-NO REFLEX-BLD
HIV 1 RNA Quant: <20 copies/mL
HIV-1 RNA Quant, Log: <1.3 Log copies/mL

## 2018-12-30 LAB — RPR: RPR: NONREACTIVE

## 2019-01-08 MED FILL — BIKTARVY 50-200-25 MG TABS: 50-200-25 | 30 days supply | Qty: 30 | Fill #0

## 2019-01-08 MED FILL — LISINOPRIL-HCTZ 20-12.5 MG: 20-12.5 | 30 days supply | Qty: 30 | Fill #0

## 2019-01-08 MED FILL — AMLODIPINE BESYLATE 10 MG T: 10 | 30 days supply | Qty: 30 | Fill #0

## 2019-02-02 ENCOUNTER — Other Ambulatory Visit: Payer: Self-pay | Admitting: Family

## 2019-02-02 DIAGNOSIS — I10 Essential (primary) hypertension: Secondary | ICD-10-CM

## 2019-02-06 MED FILL — AMLODIPINE BESYLATE 10 MG T: 10 | 30 days supply | Qty: 30 | Fill #1

## 2019-02-06 MED FILL — BIKTARVY 50-200-25 MG TABS: 50-200-25 | 30 days supply | Qty: 30 | Fill #1

## 2019-02-06 MED FILL — LISINOPRIL-HCTZ 20-12.5 MG: 20-12.5 | 30 days supply | Qty: 30 | Fill #0

## 2019-02-14 ENCOUNTER — Emergency Department (HOSPITAL_COMMUNITY): Payer: BLUE CROSS/BLUE SHIELD

## 2019-02-14 ENCOUNTER — Other Ambulatory Visit: Payer: Self-pay

## 2019-02-14 ENCOUNTER — Inpatient Hospital Stay (HOSPITAL_COMMUNITY)
Admission: EM | Admit: 2019-02-14 | Discharge: 2019-02-17 | DRG: 418 | Disposition: A | Discharge status: Home / Self Care | Payer: BLUE CROSS/BLUE SHIELD | Attending: Internal Medicine | Admitting: Internal Medicine

## 2019-02-14 ENCOUNTER — Encounter (HOSPITAL_COMMUNITY): Payer: Self-pay | Admitting: *Deleted

## 2019-02-14 DIAGNOSIS — R0602 Shortness of breath: Secondary | ICD-10-CM | POA: Diagnosis not present

## 2019-02-14 DIAGNOSIS — E875 Hyperkalemia: Secondary | ICD-10-CM | POA: Diagnosis not present

## 2019-02-14 DIAGNOSIS — R05 Cough: Secondary | ICD-10-CM | POA: Diagnosis not present

## 2019-02-14 DIAGNOSIS — K8020 Calculus of gallbladder without cholecystitis without obstruction: Secondary | ICD-10-CM

## 2019-02-14 DIAGNOSIS — Z79899 Other long term (current) drug therapy: Secondary | ICD-10-CM

## 2019-02-14 DIAGNOSIS — I10 Essential (primary) hypertension: Secondary | ICD-10-CM | POA: Diagnosis not present

## 2019-02-14 DIAGNOSIS — R197 Diarrhea, unspecified: Secondary | ICD-10-CM | POA: Diagnosis not present

## 2019-02-14 DIAGNOSIS — Z21 Asymptomatic human immunodeficiency virus [HIV] infection status: Secondary | ICD-10-CM | POA: Diagnosis not present

## 2019-02-14 DIAGNOSIS — R1011 Right upper quadrant pain: Secondary | ICD-10-CM | POA: Diagnosis not present

## 2019-02-14 DIAGNOSIS — E876 Hypokalemia: Secondary | ICD-10-CM | POA: Diagnosis not present

## 2019-02-14 DIAGNOSIS — R1010 Upper abdominal pain, unspecified: Secondary | ICD-10-CM

## 2019-02-14 DIAGNOSIS — Z87891 Personal history of nicotine dependence: Secondary | ICD-10-CM

## 2019-02-14 DIAGNOSIS — K811 Chronic cholecystitis: Secondary | ICD-10-CM | POA: Diagnosis not present

## 2019-02-14 DIAGNOSIS — K819 Cholecystitis, unspecified: Secondary | ICD-10-CM

## 2019-02-14 DIAGNOSIS — B20 Human immunodeficiency virus [HIV] disease: Secondary | ICD-10-CM

## 2019-02-14 DIAGNOSIS — K8010 Calculus of gallbladder with chronic cholecystitis without obstruction: Secondary | ICD-10-CM | POA: Diagnosis present

## 2019-02-14 DIAGNOSIS — K8066 Calculus of gallbladder and bile duct with acute and chronic cholecystitis without obstruction: Secondary | ICD-10-CM | POA: Diagnosis not present

## 2019-02-14 DIAGNOSIS — K8012 Calculus of gallbladder with acute and chronic cholecystitis without obstruction: Secondary | ICD-10-CM | POA: Diagnosis not present

## 2019-02-14 DIAGNOSIS — R112 Nausea with vomiting, unspecified: Secondary | ICD-10-CM | POA: Diagnosis not present

## 2019-02-14 DIAGNOSIS — Z6841 Body Mass Index (BMI) 40.0 and over, adult: Secondary | ICD-10-CM | POA: Diagnosis not present

## 2019-02-14 DIAGNOSIS — K8000 Calculus of gallbladder with acute cholecystitis without obstruction: Secondary | ICD-10-CM | POA: Diagnosis not present

## 2019-02-14 DIAGNOSIS — Z419 Encounter for procedure for purposes other than remedying health state, unspecified: Secondary | ICD-10-CM

## 2019-02-14 LAB — URINALYSIS, ROUTINE W REFLEX MICROSCOPIC
Bacteria, UA: NONE SEEN
Bilirubin Urine: NEGATIVE
Glucose, UA: NEGATIVE mg/dL
Ketones, ur: 20 mg/dL — AB
LEUKOCYTE UA: NEGATIVE
Nitrite: NEGATIVE
Protein, ur: 30 mg/dL — AB
Specific Gravity, Urine: 1.023 (ref 1.005–1.030)
pH: 5 (ref 5.0–8.0)

## 2019-02-14 LAB — COMPREHENSIVE METABOLIC PANEL
ALK PHOS: 74 U/L (ref 38–126)
ALT: 29 U/L (ref 0–44)
AST: 19 U/L (ref 15–41)
Albumin: 4.3 g/dL (ref 3.5–5.0)
Anion gap: 10 (ref 5–15)
BUN: 9 mg/dL (ref 6–20)
CO2: 26 mmol/L (ref 22–32)
Calcium: 9 mg/dL (ref 8.9–10.3)
Chloride: 100 mmol/L (ref 98–111)
Creatinine, Ser: 0.98 mg/dL (ref 0.61–1.24)
GFR calc Af Amer: >60 mL/min (ref >60)
GFR calc non Af Amer: >60 mL/min (ref >60)
Glucose, Bld: 89 mg/dL (ref 70–99)
Potassium: 3.5 mmol/L (ref 3.5–5.1)
SODIUM: 136 mmol/L (ref 135–145)
Total Bilirubin: 0.9 mg/dL (ref 0.3–1.2)
Total Protein: 9.4 g/dL — ABNORMAL HIGH (ref 6.5–8.1)

## 2019-02-14 LAB — CBC
HCT: 43.4 % (ref 39.0–52.0)
Hemoglobin: 13.7 g/dL (ref 13.0–17.0)
MCH: 27.7 pg (ref 26.0–34.0)
MCHC: 31.6 g/dL (ref 30.0–36.0)
MCV: 87.7 fL (ref 80.0–100.0)
Platelets: 343 10*3/uL (ref 150–400)
RBC: 4.95 MIL/uL (ref 4.22–5.81)
RDW: 13.1 % (ref 11.5–15.5)
WBC: 10.5 10*3/uL (ref 4.0–10.5)
nRBC: 0 % (ref 0.0–0.2)

## 2019-02-14 LAB — LIPASE, BLOOD: Lipase: 30 U/L (ref 11–51)

## 2019-02-14 LAB — MAGNESIUM: Magnesium: 2.3 mg/dL (ref 1.7–2.4)

## 2019-02-14 MED ORDER — BICTEGRAVIR-EMTRICITAB-TENOFOV 50-200-25 MG PO TABS
1.0000 | ORAL_TABLET | Freq: Every day | ORAL | Status: DC
  Administered 2019-02-15 – 2019-02-17 (×3): 1 via ORAL
  Filled 2019-02-14 (×3): qty 1

## 2019-02-14 MED ORDER — MORPHINE SULFATE (PF) 4 MG/ML IV SOLN
4.0000 mg | Freq: Once | INTRAVENOUS | Status: AC
  Administered 2019-02-14: 4 mg via INTRAVENOUS
  Filled 2019-02-14: qty 1

## 2019-02-14 MED ORDER — HYDROMORPHONE HCL 1 MG/ML IJ SOLN
1.0000 mg | Freq: Once | INTRAMUSCULAR | Status: AC
  Administered 2019-02-14: 1 mg via INTRAVENOUS
  Filled 2019-02-14: qty 1

## 2019-02-14 MED ORDER — SODIUM CHLORIDE 0.9 % IV BOLUS
1000.0000 mL | Freq: Once | INTRAVENOUS | Status: AC
  Administered 2019-02-14: 1000 mL via INTRAVENOUS

## 2019-02-14 MED ORDER — SODIUM CHLORIDE 0.9% FLUSH
3.0000 mL | Freq: Once | INTRAVENOUS | Status: AC
  Administered 2019-02-14: 3 mL via INTRAVENOUS

## 2019-02-14 MED ORDER — ONDANSETRON HCL 4 MG PO TABS: 4.0000 mg | ORAL_TABLET | Freq: Four times a day (QID) | ORAL | Status: DC | PRN

## 2019-02-14 MED ORDER — HYDROCODONE-ACETAMINOPHEN 5-325 MG PO TABS
1.0000 | ORAL_TABLET | ORAL | Status: DC | PRN
  Administered 2019-02-15 (×2): 2 via ORAL
  Filled 2019-02-14 (×2): qty 2

## 2019-02-14 MED ORDER — SODIUM CHLORIDE 0.9 % IV SOLN
INTRAVENOUS | Status: AC
  Administered 2019-02-14: 23:00:00 via INTRAVENOUS

## 2019-02-14 MED ORDER — ONDANSETRON HCL 4 MG/2ML IJ SOLN: 4.0000 mg | Freq: Four times a day (QID) | INTRAMUSCULAR | Status: DC | PRN

## 2019-02-14 MED ORDER — HYDROMORPHONE HCL 1 MG/ML IJ SOLN
0.5000 mg | INTRAMUSCULAR | Status: DC | PRN
  Administered 2019-02-15: 0.5 mg via INTRAVENOUS
  Filled 2019-02-14: qty 0.5

## 2019-02-14 MED ORDER — POTASSIUM CHLORIDE CRYS ER 20 MEQ PO TBCR
20.0000 meq | EXTENDED_RELEASE_TABLET | Freq: Once | ORAL | Status: AC
  Administered 2019-02-14: 20 meq via ORAL
  Filled 2019-02-14: qty 1

## 2019-02-14 MED ORDER — PIPERACILLIN-TAZOBACTAM 3.375 G IVPB
3.3750 g | Freq: Three times a day (TID) | INTRAVENOUS | Status: DC
  Administered 2019-02-14 – 2019-02-17 (×8): 3.375 g via INTRAVENOUS
  Filled 2019-02-14 (×9): qty 50

## 2019-02-14 MED ORDER — ONDANSETRON HCL 4 MG/2ML IJ SOLN
4.0000 mg | Freq: Once | INTRAMUSCULAR | Status: AC
  Administered 2019-02-14: 4 mg via INTRAVENOUS
  Filled 2019-02-14: qty 2

## 2019-02-14 MED ORDER — AMLODIPINE BESYLATE 10 MG PO TABS
10.0000 mg | ORAL_TABLET | Freq: Every day | ORAL | Status: DC
  Administered 2019-02-15 – 2019-02-17 (×3): 10 mg via ORAL
  Filled 2019-02-14 (×3): qty 1

## 2019-02-14 NOTE — H&P (Signed)
Peter Herring JYN:[id number] DOB: [birth year] DOA: 02/14/2019     PCP: Patient, No Pcp Per   Outpatient Specialists:    ID Calone NP Patient arrived to ER on 02/14/19 at 1717  Patient coming from: home Lives   With Husband    Chief Complaint:  Chief Complaint  Patient presents with   Abdominal Pain    HPI: Peter Herring is a 33 y.o. male with medical history significant of  HIV (last CD4 1080 on 12/25/18), HTN, and morbid obesity    Presented with  6 days Upper abd pain associated with nausea and vomiting. He have had gall stones in the past and this feels similar. US showed cholecystitis No Fever reports slight sore throat  mild cough He traveled to [place] last week for [employer] but not aware of any sick contacts from there.    Regarding pertinent Chronic problems:  HIV on Biktarvy HTN on almodipine and lisinopril    While in ER:  The following Work up has been ordered so far:  Orders Placed This Encounter  Procedures   DG Chest 2 View   US Abdomen Complete   Lipase, blood   Comprehensive metabolic panel   CBC   Urinalysis, Routine w reflex microscopic   Diet NPO time specified   Saline Lock IV, Maintain IV access   Consult to general surgery   Consult to hospitalist    Following Medications were ordered in ER: Medications  sodium chloride flush (NS) 0.9 % injection 3 mL (3 mLs Intravenous Given 02/14/19 2107)  morphine 4 MG/ML injection 4 mg (4 mg Intravenous Given 02/14/19 1835)  ondansetron (ZOFRAN) injection 4 mg (4 mg Intravenous Given 02/14/19 1834)  sodium chloride 0.9 % bolus 1,000 mL (0 mLs Intravenous Stopped 02/14/19 2054)  HYDROmorphone (DILAUDID) injection 1 mg (1 mg Intravenous Given 02/14/19 2108)    Significant initial  Findings: Abnormal Labs Reviewed  COMPREHENSIVE METABOLIC PANEL - Abnormal; Notable for the following components:      Result Value   Total Protein 9.4 (*)    All other components within normal limits    URINALYSIS, ROUTINE W REFLEX MICROSCOPIC - Abnormal; Notable for the following components:   APPearance HAZY (*)    Hgb urine dipstick SMALL (*)    Ketones, ur 20 (*)    Protein, ur 30 (*)    All other components within normal limits     Lactic Acid, Venous    Component Value Date/Time   LATICACIDVEN 1.96 (H) 08/11/2018 1410    Na 136 K 3.5  Cr    stable,    Lab Results  Component Value Date   CREATININE 0.98 02/14/2019   CREATININE 0.83 12/25/2018   CREATININE 0.96 08/11/2018      WBC  10.5  HG/HCT  stable,       Component Value Date/Time   HGB 13.7 02/14/2019 1812   HCT 43.4 02/14/2019 1812       UA  no evidence of UTI       CXR -  NON acute   [place] - Cholelithiasis - secondary vs acute cholecystitis  ECG: not obtained      ED Triage Vitals  Enc Vitals Group     BP 02/14/19 1733 (!) 148/86     Pulse Rate 02/14/19 1733 86     Resp 02/14/19 1733 18     Temp 02/14/19 1733 98.4 F (36.9 C)     Temp Source 02/14/19 1733 Oral  SpO2 02/14/19 1733 99 %     Weight --      Height --      Head Circumference --      Peak Flow --      Pain Score 02/14/19 2107 7     Pain Loc --      Pain Edu? --      Excl. in GC? --   TMAX(24)@       Latest  Blood pressure 140/85, pulse 82, temperature 98.6 F (37 C), temperature source Oral, resp. rate 16, SpO2 100 %.    ER Provider Called:     Dr Gerrit Friends  They Recommend admit to medicine HIDA scan in AM Will see in AM   Hospitalist was called for admission for possible cholecytitis   Review of Systems:    Pertinent positives include: fatigue, abdominal pain, nausea, vomiting Diarrhea, loss of appetite, Constitutional:  No weight loss, night sweats, Fevers, chills,  weight loss  HEENT:  No headaches, Difficulty swallowing,Tooth/dental problems,Sore throat,  No sneezing, itching, ear ache, nasal congestion, post nasal drip,  Cardio-vascular:  No chest pain, Orthopnea, PND, anasarca, dizziness,  palpitations.no Bilateral lower extremity swelling  GI:  No heartburn, indigestion,  change in bowel habits,  melena, blood in stool, hematemesis Resp:  no shortness of breath at rest. No dyspnea on exertion, No excess mucus, no productive cough, No non-productive cough, No coughing up of blood.No change in color of mucus.No wheezing. Skin:  no rash or lesions. No jaundice GU:  no dysuria, change in color of urine, no urgency or frequency. No straining to urinate.  No flank pain.  Musculoskeletal:  No joint pain or no joint swelling. No decreased range of motion. No back pain.  Psych:  No change in mood or affect. No depression or anxiety. No memory loss.  Neuro: no localizing neurological complaints, no tingling, no weakness, no double vision, no gait abnormality, no slurred speech, no confusion  All systems reviewed and apart from HOPI all are negative  Past Medical History:   Past Medical History:  Diagnosis Date   HIV infection (HCC)    Hypertension       History reviewed. No pertinent surgical history.  Social History:  Ambulatory  independently     reports that he has quit smoking. His smoking use included cigarettes. He has a 6.00 pack-year smoking history. He has never used smokeless tobacco. He reports that he does not drink alcohol or use drugs.  Family History:   Family History  Problem Relation Age of Onset   Cancer Mother    Diabetes Mother    Diabetes Father    Heart disease Father    Diabetes Sister     Allergies: No Known Allergies   Prior to Admission medications   Medication Sig Start Date End Date Taking? Authorizing Provider  amLODipine (NORVASC) 10 MG tablet Take 1 tablet (10 mg total) by mouth daily. 12/25/18  Yes Veryl Speak, FNP  bictegravir-emtricitabine-tenofovir AF (BIKTARVY) 50-200-25 MG TABS tablet Take 1 tablet by mouth daily. 12/25/18  Yes Veryl Speak, FNP  guaiFENesin (MUCINEX) 600 MG 12 hr tablet Take 1,200 mg by  mouth daily as needed for to loosen phlegm.   Yes [provider]  lisinopril-hydrochlorothiazide (PRINZIDE,ZESTORETIC) 20-12.5 MG tablet TAKE 1 TABLET BY MOUTH DAILY  CALL [phone] TO SCHEDULE AN APPOINTMENT  02/02/19  Yes Veryl Speak, FNP  ondansetron (ZOFRAN) 8 MG tablet Take 8 mg by mouth every 8 (eight) hours  as needed for nausea or vomiting.   Yes [provider]  oxycodone (OXY-IR) 5 MG capsule Take 5 mg by mouth every 4 (four) hours as needed for pain.   Yes [provider]   Physical Exam: Blood pressure 140/85, pulse 82, temperature 98.6 F (37 C), temperature source Oral, resp. rate 16, SpO2 100 %. 1. General:  in No  Acute distress   well  -appearing 2. Psychological: Alert and Oriented 3. Head/ENT:     Dry Mucous Membranes                          Head Non traumatic, neck supple                            Poor Dentition 4. SKIN:   decreased Skin turgor,  Skin clean Dry and intact no rash 5. Heart: Regular rate and rhythm no  Murmur, no Rub or gallop 6. Lungs: Clear to auscultation bilaterally, no wheezes or crackles   7. Abdomen: Soft, slight tender, Non distended   obese bowel sounds present 8. Lower extremities: no clubbing, cyanosis, no edema 9. Neurologically Grossly intact, moving all 4 extremities equally   10. MSK: Normal range of motion   LABS:     Recent Labs  Lab 02/14/19 1812  WBC 10.5  HGB 13.7  HCT 43.4  MCV 87.7  PLT 343   Basic Metabolic Panel: Recent Labs  Lab 02/14/19 1812  NA 136  K 3.5  CL 100  CO2 26  GLUCOSE 89  BUN 9  CREATININE 0.98  CALCIUM 9.0      Recent Labs  Lab 02/14/19 1812  AST 19  ALT 29  ALKPHOS 74  BILITOT 0.9  PROT 9.4*  ALBUMIN 4.3   Recent Labs  Lab 02/14/19 1812  LIPASE 30   No results for input(s): AMMONIA in the last 168 hours.    HbA1C: No results for input(s): HGBA1C in the last 72 hours. CBG: No results for input(s): GLUCAP in the last 168 hours.      Urine analysis:    Component Value Date/Time   COLORURINE YELLOW 02/14/2019 1745   APPEARANCEUR HAZY (A) 02/14/2019 1745   LABSPEC 1.023 02/14/2019 1745   PHURINE 5.0 02/14/2019 1745   GLUCOSEU NEGATIVE 02/14/2019 1745   HGBUR SMALL (A) 02/14/2019 1745   BILIRUBINUR NEGATIVE 02/14/2019 1745   BILIRUBINUR negative 06/11/2016 1811   KETONESUR 20 (A) 02/14/2019 1745   PROTEINUR 30 (A) 02/14/2019 1745   UROBILINOGEN 1.0 06/11/2016 1811   NITRITE NEGATIVE 02/14/2019 1745   LEUKOCYTESUR NEGATIVE 02/14/2019 1745    Cultures: No results found for: SDES, SPECREQUEST, CULT, REPTSTATUS   Radiological Exams on Admission: Dg Chest 2 View  Result Date: 02/14/2019 CLINICAL DATA:  Shortness of breath and cough EXAM: CHEST - 2 VIEW COMPARISON:  None. FINDINGS: The lungs are clear. Heart size and pulmonary vascularity are within normal limits. No adenopathy. No bone lesions. IMPRESSION: No edema or consolidation. Electronically Signed   By: Bretta Bang III M.D.   On: 02/14/2019 18:46   US Abdomen Complete  Result Date: 02/14/2019 CLINICAL DATA:  Upper abdominal pain EXAM: ABDOMEN ULTRASOUND COMPLETE COMPARISON:  Ultrasound 08/11/2018 FINDINGS: Gallbladder: Multiple shadowing stones measuring up to 1.4 mm. Increased wall thickness at 7.5 mm. Negative sonographic Murphy. Common bile duct: Diameter: 3.9 mm Liver: No focal lesion identified. Within normal limits in parenchymal echogenicity. Portal  vein is patent on color Doppler imaging with normal direction of blood flow towards the liver. IVC: No abnormality visualized. Pancreas: Visualized portion unremarkable. Spleen: Size and appearance within normal limits. Right Kidney: Length: 12.9 cm. Echogenicity within normal limits. No mass or hydronephrosis visualized. Left Kidney: Length: 12.4 cm. Echogenicity within normal limits. No mass or hydronephrosis visualized. Abdominal aorta: No aneurysm visualized. Maximum AP diameter of 2.8 cm Other findings:  None. IMPRESSION: 1. Cholelithiasis with gallbladder wall thickening but negative for sonographic Murphy or pericholecystic fluid. Findings are nonspecific and could be secondary to acute or chronic cholecystitis, hepatic disease, or edematous forming states. Nuclear medicine hepatobiliary imaging could be helpful to assess for acute gallbladder disease. 2. Maximum AP diameter of the aorta measuring 2.8 cm Ectatic abdominal aorta at risk for aneurysm development. Recommend followup by ultrasound in 5 years. This recommendation follows ACR consensus guidelines: White Paper of the ACR Incidental Findings Committee II on Vascular Findings. J Am Coll Radiol 2013; 10:789-794. Electronically Signed   By: Jasmine Pang M.D.   On: 02/14/2019 19:38    Chart has been reviewed    Assessment/Plan  33 y.o. male with medical history significant of  HIV (last CD4 1080 on 12/25/18), HTN, and morbid obesity  Admitted for cholecystitis   Present on Admission:  Cholecystitis - admit for IV rehydration and pain control, bowel rest, and empiric antibiotics (zosyn)    Appreciate general surgery consult  HIDA scan ordered  plan for cholecystectomy prior to discharge if indicated    HIV disease (HCC) -we will continue home medications check HIV viral CD4 count  Essential hypertension -stable continue amlodipine hold hydrochlorothiazide and lisinopril in preparation for possible surgical intervention and evidence of decreased p.o. intake  Hypokalemia - will replete and check magnesium level   Other plan as per orders.  DVT prophylaxis:  SCD    Code Status:  FULL CODE   as per patient  I had personally discussed CODE STATUS with patient and family  Family Communication:   Family not   at  Bedside  plan of care was discussed with   Husband   Disposition Plan:       To home once workup is complete and patient is stable                                      Consults called: general Surgery     Admission  status:   inpatient     Expect 2 midnight stay secondary to severity of patient's current illness including     Severe lab/radiological/exam abnormalities including:  Cholecystitis     and extensive comorbidities including:   HIV  That are currently affecting medical management.   I expect  patient to be hospitalized for 2 midnights requiring inpatient medical care.  Patient is at high risk for adverse outcome (such as loss of life or disability) if not treated.  Indication for inpatient stay as follows:    severe pain requiring acute inpatient management,  inability to maintain oral hydration    Need for operative/procedural  intervention    Need for IV antibiotics, IV fluids, iV pain medications    Level of care      medical floor            Therisa Doyne 02/14/2019, 10:57 PM    [facility]     after 2 AM please page floor  coverage PA If 7AM-7PM, please contact the day team taking care of the patient using [email or web address]

## 2019-02-14 NOTE — ED Triage Notes (Signed)
Pt complains of abdominal pain since Sunday. Pt has hx of gallstones and feels symptoms are similar.

## 2019-02-14 NOTE — ED Notes (Signed)
ED TO INPATIENT HANDOFF REPORT  ED Nurse Name and Phone #:  Jani Files [phone]   Name/Age/Gender Peter Herring 33 y.o. male Room/Bed: WA15/WA15  Code Status   Code Status: Not on file  Home/SNF/Other Home Patient oriented to: self, place, time and situation Is this baseline? Yes   Triage Complete: Triage complete  Chief Complaint Gallstone / Abdominal Pain / Sore Throat   Triage Note Pt complains of abdominal pain since Sunday. Pt has hx of gallstones and feels symptoms are similar.    Allergies No Known Allergies  Level of Care/Admitting Diagnosis ED Disposition    ED Disposition Condition Comment   Admit  Hospital Area: [facility] [[id number]]  Level of Care: Med-Surg [16]  Diagnosis: Cholecystitis [808811]  Admitting Physician: Therisa Doyne [[id number]]  Attending Physician: Therisa Doyne [[id number]]  Estimated length of stay: 3 - 4 days  Certification:: I certify this patient will need inpatient services for at least 2 midnights  PT Class (Do Not Modify): Inpatient [101]  PT Acc Code (Do Not Modify): Private [1]       B Medical/Surgery History Past Medical History:  Diagnosis Date  . HIV infection (HCC)   . Hypertension    History reviewed. No pertinent surgical history.   A IV Location/Drains/Wounds Patient Lines/Drains/Airways Status   Active Line/Drains/Airways    Name:   Placement date:   Placement time:   Site:   Days:   Peripheral IV 02/14/19 Left Hand   02/14/19    1811    Hand   less than 1          Intake/Output Last 24 hours No intake or output data in the 24 hours ending 02/14/19 2207  Labs/Imaging Results for orders placed or performed during the hospital encounter of 02/14/19 (from the past 48 hour(s))  Urinalysis, Routine w reflex microscopic     Status: Abnormal   Collection Time: 02/14/19  5:45 PM  Result Value Ref Range   Color, Urine YELLOW YELLOW   APPearance HAZY (A) CLEAR   Specific Gravity, Urine  1.023 1.005 - 1.030   pH 5.0 5.0 - 8.0   Glucose, UA NEGATIVE NEGATIVE mg/dL   Hgb urine dipstick SMALL (A) NEGATIVE   Bilirubin Urine NEGATIVE NEGATIVE   Ketones, ur 20 (A) NEGATIVE mg/dL   Protein, ur 30 (A) NEGATIVE mg/dL   Nitrite NEGATIVE NEGATIVE   Leukocytes,Ua NEGATIVE NEGATIVE   RBC / HPF 0-5 0 - 5 RBC/hpf   WBC, UA 0-5 0 - 5 WBC/hpf   Bacteria, UA NONE SEEN NONE SEEN   Mucus PRESENT     Comment: Performed at [facility], [address] [place], [place] [address]  Lipase, blood     Status: None   Collection Time: 02/14/19  6:12 PM  Result Value Ref Range   Lipase 30 11 - 51 U/L    Comment: Performed at [facility], [address], [place], [place] [address]  Comprehensive metabolic panel     Status: Abnormal   Collection Time: 02/14/19  6:12 PM  Result Value Ref Range   Sodium 136 135 - 145 mmol/L   Potassium 3.5 3.5 - 5.1 mmol/L   Chloride 100 98 - 111 mmol/L   CO2 26 22 - 32 mmol/L   Glucose, Bld 89 70 - 99 mg/dL   BUN 9 6 - 20 mg/dL   Creatinine, Ser 2.92 0.61 - 1.24 mg/dL   Calcium 9.0 8.9 - 44.6 mg/dL  Total Protein 9.4 (H) 6.5 - 8.1 g/dL   Albumin 4.3 3.5 - 5.0 g/dL   AST 19 15 - 41 U/L   ALT 29 0 - 44 U/L   Alkaline Phosphatase 74 38 - 126 U/L   Total Bilirubin 0.9 0.3 - 1.2 mg/dL   GFR calc non Af Amer >60 >60 mL/min   GFR calc Af Amer >60 >60 mL/min   Anion gap 10 5 - 15    Comment: Performed at [facility], [address] [place], [place] [address]  CBC     Status: None   Collection Time: 02/14/19  6:12 PM  Result Value Ref Range   WBC 10.5 4.0 - 10.5 K/uL   RBC 4.95 4.22 - 5.81 MIL/uL   Hemoglobin 13.7 13.0 - 17.0 g/dL   HCT 60.4 54.0 - 98.1 %   MCV 87.7 80.0 - 100.0 fL   MCH 27.7 26.0 - 34.0 pg   MCHC 31.6 30.0 - 36.0 g/dL   RDW 19.1 47.8 - 29.5 %   Platelets 343 150 - 400 K/uL   nRBC 0.0 0.0 - 0.2 %    Comment: Performed at [facility], [address],  [place], [place] [address]   Dg Chest 2 View  Result Date: 02/14/2019 CLINICAL DATA:  Shortness of breath and cough EXAM: CHEST - 2 VIEW COMPARISON:  None. FINDINGS: The lungs are clear. Heart size and pulmonary vascularity are within normal limits. No adenopathy. No bone lesions. IMPRESSION: No edema or consolidation. Electronically Signed   By: Bretta Bang III M.D.   On: 02/14/2019 18:46   US Abdomen Complete  Result Date: 02/14/2019 CLINICAL DATA:  Upper abdominal pain EXAM: ABDOMEN ULTRASOUND COMPLETE COMPARISON:  Ultrasound 08/11/2018 FINDINGS: Gallbladder: Multiple shadowing stones measuring up to 1.4 mm. Increased wall thickness at 7.5 mm. Negative sonographic Murphy. Common bile duct: Diameter: 3.9 mm Liver: No focal lesion identified. Within normal limits in parenchymal echogenicity. Portal vein is patent on color Doppler imaging with normal direction of blood flow towards the liver. IVC: No abnormality visualized. Pancreas: Visualized portion unremarkable. Spleen: Size and appearance within normal limits. Right Kidney: Length: 12.9 cm. Echogenicity within normal limits. No mass or hydronephrosis visualized. Left Kidney: Length: 12.4 cm. Echogenicity within normal limits. No mass or hydronephrosis visualized. Abdominal aorta: No aneurysm visualized. Maximum AP diameter of 2.8 cm Other findings: None. IMPRESSION: 1. Cholelithiasis with gallbladder wall thickening but negative for sonographic Murphy or pericholecystic fluid. Findings are nonspecific and could be secondary to acute or chronic cholecystitis, hepatic disease, or edematous forming states. Nuclear medicine hepatobiliary imaging could be helpful to assess for acute gallbladder disease. 2. Maximum AP diameter of the aorta measuring 2.8 cm Ectatic abdominal aorta at risk for aneurysm development. Recommend followup by ultrasound in 5 years. This recommendation follows ACR consensus guidelines: White Paper of the ACR Incidental Findings  Committee II on Vascular Findings. J Am Coll Radiol 2013; 10:789-794. Electronically Signed   By: Jasmine Pang M.D.   On: 02/14/2019 19:38    Pending Labs Unresulted Labs (From admission, onward)   None      Vitals/Pain Today's Vitals   02/14/19 1949 02/14/19 2107 02/14/19 2108 02/14/19 2144  BP: (!) 176/89  140/85   Pulse: 80  82   Resp: 18  16   Temp:   98.6 F (37 C)   TempSrc:   Oral   SpO2: 100%  100%   PainSc:  7  7  0-No  pain    Isolation Precautions No active isolations  Medications Medications  sodium chloride flush (NS) 0.9 % injection 3 mL (3 mLs Intravenous Given 02/14/19 2107)  morphine 4 MG/ML injection 4 mg (4 mg Intravenous Given 02/14/19 1835)  ondansetron (ZOFRAN) injection 4 mg (4 mg Intravenous Given 02/14/19 1834)  sodium chloride 0.9 % bolus 1,000 mL (0 mLs Intravenous Stopped 02/14/19 2054)  HYDROmorphone (DILAUDID) injection 1 mg (1 mg Intravenous Given 02/14/19 2108)    Mobility walks     Focused Assessments   Gi assessment    Recommendations: See Admitting Provider Note  Report given to:   Additional Notes:

## 2019-02-14 NOTE — ED Provider Notes (Addendum)
[facility]-EMERGENCY DEPT Provider Note   CSN: [id number] Arrival date & time: 02/14/19  1717    History   Chief Complaint Chief Complaint  Patient presents with   Abdominal Pain    HPI    Peter Herring is a 33 y.o. male with a PMHx of HIV (last CD4 1080 on 12/25/18), HTN, and morbid obesity, who presents to the ED with complaints of upper abdominal pain that began 6 days ago.  He describes his pain as 7/10 constant sharp nonradiating upper abdominal pain with no known aggravating factors and unimproved with oxycodone and nausea medicines.  He states he thinks this is from his gallstones.  He reports associated nausea, 1-2 episodes daily of nonbloody nonbilious emesis, and 2-3 episodes daily of nonbloody diarrhea.  He states that he recently started the keto diet about 2 weeks ago, ate [place] the day before his symptoms started.  He also mentions that on Monday, 5 days ago, he started having a mild sore throat and cough with yellowish sputum production, but he thinks it is just a cold, he states that it has been improving.  He only mentions it because he was taking Mucinex and allergy pills for it which have helped, so he was not sure whether those medications could have caused his abdominal pain to worsen.  He denies having any fevers, rhinorrhea, drooling, trismus, ear pain or drainage, body aches, or any other associated symptoms related to that.  He went to [facility] for a basketball game about 2 weeks ago, but there were no sick contacts there, and to his knowledge there was no COVID19 confirmed cases there.  He reports he does not think he has coronavirus.  His URI symptoms are improving.  He also denies having any chest pain, shortness of breath, hematemesis, melena, hematochezia, constipation, obstipation, dysuria, hematuria, numbness, tingling, focal weakness, or any other complaints at this time.  No known sick contacts for his abdominal complaints either, no suspicious  food intake, no alcohol use, no NSAID use.  The history is provided by the patient and medical records. No language interpreter was used.  Abdominal Pain  Associated symptoms: cough (improving), diarrhea, nausea, sore throat and vomiting   Associated symptoms: no chest pain, no chills, no constipation, no dysuria, no fever, no hematuria and no shortness of breath     Past Medical History:  Diagnosis Date   HIV infection (HCC)    Hypertension     Patient Active Problem List   Diagnosis Date Noted   Healthcare maintenance 12/25/2018   HIV disease (HCC) 06/10/2018   Essential hypertension 06/10/2018   Obesity, morbid, BMI 40.0-49.9 (HCC) 06/10/2018    History reviewed. No pertinent surgical history.      Home Medications    Prior to Admission medications   Medication Sig Start Date End Date Taking? Authorizing Provider  amLODipine (NORVASC) 10 MG tablet Take 1 tablet (10 mg total) by mouth daily. 12/25/18   Veryl Speak, FNP  bictegravir-emtricitabine-tenofovir AF (BIKTARVY) 50-200-25 MG TABS tablet Take 1 tablet by mouth daily. 12/25/18   Veryl Speak, FNP  lisinopril-hydrochlorothiazide (PRINZIDE,ZESTORETIC) 20-12.5 MG tablet TAKE 1 TABLET BY MOUTH DAILY CALL [phone] TO SCHEDULE AN APPOINTMENT 02/02/19   Veryl Speak, FNP    Family History Family History  Problem Relation Age of Onset   Cancer Mother    Diabetes Mother    Diabetes Father    Heart disease Father    Diabetes Sister  Social History Social History   Tobacco Use   Smoking status: Former Smoker    Packs/day: 0.50    Years: 12.00    Pack years: 6.00    Types: Cigarettes   Smokeless tobacco: Never Used  Substance Use Topics   Alcohol use: Never    Alcohol/week: 0.0 standard drinks    Frequency: Never   Drug use: Never     Allergies   Patient has no known allergies.   Review of Systems Review of Systems  Constitutional: Negative for chills and fever.    HENT: Positive for sore throat. Negative for drooling, ear discharge, ear pain, rhinorrhea and trouble swallowing.   Respiratory: Positive for cough (improving). Negative for shortness of breath.   Cardiovascular: Negative for chest pain.  Gastrointestinal: Positive for abdominal pain, diarrhea, nausea and vomiting. Negative for blood in stool and constipation.  Genitourinary: Negative for dysuria and hematuria.  Musculoskeletal: Negative for arthralgias and myalgias.  Skin: Negative for color change.  Allergic/Immunologic: Positive for immunocompromised state (HIV).  Neurological: Negative for weakness and numbness.  Psychiatric/Behavioral: Negative for confusion.   All other systems reviewed and are negative for acute change except as noted in the HPI.    Physical Exam Updated Vital Signs BP (!) 148/86 (BP Location: Right Arm)    Pulse 86    Temp 98.4 F (36.9 C) (Oral)    Resp 18    SpO2 99%   Physical Exam Vitals signs and nursing note reviewed.  Constitutional:      General: He is not in acute distress.    Appearance: Normal appearance. He is well-developed. He is not toxic-appearing.     Comments: Afebrile, nontoxic, NAD  HENT:     Head: Normocephalic and atraumatic.     Nose: Nose normal.     Mouth/Throat:     Mouth: Mucous membranes are moist.     Pharynx: Oropharynx is clear. Uvula midline. Posterior oropharyngeal erythema present. No pharyngeal swelling or uvula swelling.     Tonsils: No tonsillar exudate or tonsillar abscesses.     Comments: Oropharynx very minimally injected, without uvular swelling or deviation, no trismus or drooling, no tonsillar swelling or exudates.  No PTA.  Eyes:     General:        Right eye: No discharge.        Left eye: No discharge.     Conjunctiva/sclera: Conjunctivae normal.  Neck:     Musculoskeletal: Normal range of motion and neck supple.  Cardiovascular:     Rate and Rhythm: Normal rate and regular rhythm.     Pulses: Normal  pulses.     Heart sounds: Normal heart sounds, S1 normal and S2 normal. No murmur. No friction rub. No gallop.   Pulmonary:     Effort: Pulmonary effort is normal. No respiratory distress.     Breath sounds: Normal breath sounds. No decreased breath sounds, wheezing, rhonchi or rales.     Comments: CTAB in all lung fields, no w/r/r, no hypoxia or increased WOB, speaking in full sentences, SpO2 99% on RA  Abdominal:     General: Bowel sounds are normal. There is no distension.     Palpations: Abdomen is soft. Abdomen is not rigid.     Tenderness: There is abdominal tenderness in the right upper quadrant. There is no right CVA tenderness, left CVA tenderness, guarding or rebound. Positive signs include Murphy's sign. Negative signs include McBurney's sign.     Comments: Soft, nondistended, +BS throughout,  with moderate RUQ TTP, no r/g/r, +murphy's, neg mcburney's, no CVA TTP   Musculoskeletal: Normal range of motion.  Skin:    General: Skin is warm and dry.     Findings: No rash.  Neurological:     Mental Status: He is alert and oriented to person, place, and time.     Sensory: Sensation is intact. No sensory deficit.     Motor: Motor function is intact.  Psychiatric:        Mood and Affect: Mood and affect normal.        Behavior: Behavior normal.      ED Treatments / Results  Labs (all labs ordered are listed, but only abnormal results are displayed) Labs Reviewed  COMPREHENSIVE METABOLIC PANEL - Abnormal; Notable for the following components:      Result Value   Total Protein 9.4 (*)    All other components within normal limits  URINALYSIS, ROUTINE W REFLEX MICROSCOPIC - Abnormal; Notable for the following components:   APPearance HAZY (*)    Hgb urine dipstick SMALL (*)    Ketones, ur 20 (*)    Protein, ur 30 (*)    All other components within normal limits  LIPASE, BLOOD  CBC    EKG None  Radiology Dg Chest 2 View  Result Date: 02/14/2019 CLINICAL DATA:  Shortness  of breath and cough EXAM: CHEST - 2 VIEW COMPARISON:  None. FINDINGS: The lungs are clear. Heart size and pulmonary vascularity are within normal limits. No adenopathy. No bone lesions. IMPRESSION: No edema or consolidation. Electronically Signed   By: Bretta Bang III M.D.   On: 02/14/2019 18:46   US Abdomen Complete  Result Date: 02/14/2019 CLINICAL DATA:  Upper abdominal pain EXAM: ABDOMEN ULTRASOUND COMPLETE COMPARISON:  Ultrasound 08/11/2018 FINDINGS: Gallbladder: Multiple shadowing stones measuring up to 1.4 mm. Increased wall thickness at 7.5 mm. Negative sonographic Murphy. Common bile duct: Diameter: 3.9 mm Liver: No focal lesion identified. Within normal limits in parenchymal echogenicity. Portal vein is patent on color Doppler imaging with normal direction of blood flow towards the liver. IVC: No abnormality visualized. Pancreas: Visualized portion unremarkable. Spleen: Size and appearance within normal limits. Right Kidney: Length: 12.9 cm. Echogenicity within normal limits. No mass or hydronephrosis visualized. Left Kidney: Length: 12.4 cm. Echogenicity within normal limits. No mass or hydronephrosis visualized. Abdominal aorta: No aneurysm visualized. Maximum AP diameter of 2.8 cm Other findings: None. IMPRESSION: 1. Cholelithiasis with gallbladder wall thickening but negative for sonographic Murphy or pericholecystic fluid. Findings are nonspecific and could be secondary to acute or chronic cholecystitis, hepatic disease, or edematous forming states. Nuclear medicine hepatobiliary imaging could be helpful to assess for acute gallbladder disease. 2. Maximum AP diameter of the aorta measuring 2.8 cm Ectatic abdominal aorta at risk for aneurysm development. Recommend followup by ultrasound in 5 years. This recommendation follows ACR consensus guidelines: White Paper of the ACR Incidental Findings Committee II on Vascular Findings. J Am Coll Radiol 2013; 10:789-794. Electronically Signed   By:  Jasmine Pang M.D.   On: 02/14/2019 19:38    Procedures Procedures (including critical care time)  Medications Ordered in ED Medications  sodium chloride flush (NS) 0.9 % injection 3 mL (has no administration in time range)  HYDROmorphone (DILAUDID) injection 1 mg (has no administration in time range)  morphine 4 MG/ML injection 4 mg (4 mg Intravenous Given 02/14/19 1835)  ondansetron (ZOFRAN) injection 4 mg (4 mg Intravenous Given 02/14/19 1834)  sodium chloride 0.9 % bolus  1,000 mL (1,000 mLs Intravenous New Bag/Given 02/14/19 1834)     Initial Impression / Assessment and Plan / ED Course  I have reviewed the triage vital signs and the nursing notes.  Pertinent labs & imaging results that were available during my care of the patient were reviewed by me and considered in my medical decision making (see chart for details).        33 y.o. male here with upper abd pain/n/v/d x6 days. Also incidentally mentions having a sore throat and cough with yellow sputum production x 5 days but this is improving. [place] ago [place] a basketball game but no known sick contacts there. He has not had a fever.  On exam, moderate RUQ abdominal tenderness, with positive Murphy sign, but non-peritoneal.  Throat mildly injected but no tonsillar swelling or exudates, lung sounds clear.  No increased work of breathing, no hypoxia, no fever. Doubt COVID19, he is primarily here for his abdominal pain which he thinks is probably from his gallstones. I suspect this is likely. Will get labs, abd U/S, and CXR to ensure no PNA. Will give pain/nausea meds, fluids, and reassess shortly.   8:33 PM CBC WNL. CMP WNL. Lipase WNL. U/A without evidence of UTI. CXR negative for PNA. Abd U/S with cholelithiasis with gallbladder wall thickening but reportedly negative murphy's sign and no pericholecystic fluid, could be acute vs chronic cholecystitis or other etiology. On my initial exam he had a positive murphy's sign  prior to pain medications being given, I suspect acute cholecystitis. Pt still having pain. Will consult surgery team to discuss case. Will give more pain meds. Will reassess shortly. Discussed case with my attending Dr. Lockie Molauratolo who agrees with plan.   8:52 PM Dr. Gerrit FriendsGerkin of surgery team returning page and will admit. Holding orders to be placed by admitting team. Please see their notes for further documentation of care. I appreciate their help with this pleasant pt's care. Pt stable at time of admission.   ADDENDUM 9:02 PM: Dr. Gerrit FriendsGerkin calling back, states given his comorbidities, he would like medical admission and HIDA scan ordered (likely tomorrow), and he will consult on the pt while he's here. Will consult for admission.   9:41 PM [facility] and will admit. Holding orders to be placed by admitting team. Please see their notes for further documentation of care. I appreciate their help with this pleasant pt's care. Pt stable at time of admission.    Final Clinical Impressions(s) / ED Diagnoses   Final diagnoses:  Symptomatic cholelithiasis  Upper abdominal pain  Nausea and vomiting in adult patient    ED Discharge Orders    [address]None       Zhania Shaheen, [place], [place] 02/14/19 [address]2053    Kerline Trahan, [place], [place]-C 02/14/19 2141    Virgina Norfolkuratolo, Adam, DO 02/14/19 2328

## 2019-02-15 ENCOUNTER — Encounter (HOSPITAL_COMMUNITY): Payer: Self-pay | Admitting: Surgery

## 2019-02-15 ENCOUNTER — Inpatient Hospital Stay (HOSPITAL_COMMUNITY): Payer: BLUE CROSS/BLUE SHIELD

## 2019-02-15 DIAGNOSIS — K8010 Calculus of gallbladder with chronic cholecystitis without obstruction: Secondary | ICD-10-CM

## 2019-02-15 DIAGNOSIS — R1011 Right upper quadrant pain: Secondary | ICD-10-CM | POA: Diagnosis present

## 2019-02-15 LAB — COMPREHENSIVE METABOLIC PANEL
ALT: 26 U/L (ref 0–44)
AST: 16 U/L (ref 15–41)
Albumin: 3.4 g/dL — ABNORMAL LOW (ref 3.5–5.0)
Alkaline Phosphatase: 64 U/L (ref 38–126)
Anion gap: 9 (ref 5–15)
BUN: 9 mg/dL (ref 6–20)
CO2: 26 mmol/L (ref 22–32)
Calcium: 8.7 mg/dL — ABNORMAL LOW (ref 8.9–10.3)
Chloride: 103 mmol/L (ref 98–111)
Creatinine, Ser: 1.05 mg/dL (ref 0.61–1.24)
GFR calc Af Amer: >60 mL/min (ref >60)
GFR calc non Af Amer: >60 mL/min (ref >60)
Glucose, Bld: 91 mg/dL (ref 70–99)
Potassium: 3.6 mmol/L (ref 3.5–5.1)
Sodium: 138 mmol/L (ref 135–145)
Total Bilirubin: 0.9 mg/dL (ref 0.3–1.2)
Total Protein: 7.9 g/dL (ref 6.5–8.1)

## 2019-02-15 LAB — CBC
HCT: 39.3 % (ref 39.0–52.0)
Hemoglobin: 12.7 g/dL — ABNORMAL LOW (ref 13.0–17.0)
MCH: 28.2 pg (ref 26.0–34.0)
MCHC: 32.3 g/dL (ref 30.0–36.0)
MCV: 87.1 fL (ref 80.0–100.0)
Platelets: 304 10*3/uL (ref 150–400)
RBC: 4.51 MIL/uL (ref 4.22–5.81)
RDW: 13 % (ref 11.5–15.5)
WBC: 9 10*3/uL (ref 4.0–10.5)
nRBC: 0 % (ref 0.0–0.2)

## 2019-02-15 LAB — TSH: TSH: 1.643 u[IU]/mL (ref 0.350–4.500)

## 2019-02-15 LAB — PHOSPHORUS: Phosphorus: 4 mg/dL (ref 2.5–4.6)

## 2019-02-15 LAB — MAGNESIUM: MAGNESIUM: 2 mg/dL (ref 1.7–2.4)

## 2019-02-15 LAB — MRSA PCR SCREENING: MRSA by PCR: NEGATIVE

## 2019-02-15 MED ORDER — MORPHINE SULFATE (PF) 4 MG/ML IV SOLN
4.0000 mg | Freq: Once | INTRAVENOUS | Status: AC
  Administered 2019-02-15: 4 mg via INTRAVENOUS
  Filled 2019-02-15: qty 1

## 2019-02-15 MED ORDER — TECHNETIUM TC 99M MEBROFENIN IV KIT
5.2200 | PACK | Freq: Once | INTRAVENOUS | Status: AC | PRN
  Administered 2019-02-15: 5.22 via INTRAVENOUS

## 2019-02-15 NOTE — Progress Notes (Signed)
PROGRESS NOTE    Peter Herring  ERX:[id number] DOB: [birth year] DOA: 02/14/2019 PCP: Patient, No Pcp Per   Brief Narrative:  Peter Herring is a 33 y.o. male with medical history significant of  HIV (last CD4 1080 on 12/25/18), HTN, and morbid obesity. Presented with  6 days Upper abd pain associated with nausea and vomiting. He have had gall stones in the past and this feels similar. US showed cholecystitis  Assessment & Plan:   Principal Problem:   Abdominal pain, right upper quadrant Active Problems:   HIV disease (HCC)   Essential hypertension   Cholelithiasis with chronic cholecystitis   Hypokalemia   Abdominal pain, with nausea and vomiting: - sec to acute cholecystitis.  - surgery consulted , plan for lap cholecystectomy.    Hypertension: Well controlled.    HIV disease: resume home meds. BIKTARVY      DVT prophylaxis:  SCD's Code Status: full code.  Family Communication: none at bedside.  Disposition Plan: pending further work up.    Consultants:   Surgery.    Procedures: NM hepatobiliary   Antimicrobials: zosyn since admission.    Subjective: Pain controlled.   Objective: Vitals:   02/14/19 2254 02/15/19 0603 02/15/19 1200 02/15/19 1610  BP: (!) 151/94 121/64  (!) 144/87  Pulse: 91 78  83  Resp: 18 18    Temp: 97.8 F (36.6 C) 98.2 F (36.8 C)  97.6 F (36.4 C)  TempSrc: Oral Oral  Oral  SpO2: 97% 94%  97%  Weight:   (!) 136.9 kg   Height:   5\' 11"  (1.803 m)     Intake/Output Summary (Last 24 hours) at 02/15/2019 1707 Last data filed at 02/15/2019 1700 Gross per 24 hour  Intake 604.87 ml  Output 1100 ml  Net -495.13 ml   Filed Weights   02/15/19 1200  Weight: (!) 136.9 kg    Examination:  General exam: Appears calm and comfortable  Respiratory system: Clear to auscultation. Respiratory effort normal. Cardiovascular system: S1 & S2 heard, RRR. No JVD, murmurs, rubs, gallops or clicks. No pedal edema. Gastrointestinal system:  Abdomen is soft mildly tender in the RUQ.  Central nervous system: Alert and oriented. No focal neurological deficits. Extremities: Symmetric 5 x 5 power. Skin: No rashes, lesions or ulcers Psychiatry: Mood & affect appropriate.     Data Reviewed: I have personally reviewed following labs and imaging studies  CBC: Recent Labs  Lab 02/14/19 1812 02/15/19 0405  WBC 10.5 9.0  HGB 13.7 12.7*  HCT 43.4 39.3  MCV 87.7 87.1  PLT 343 304   Basic Metabolic Panel: Recent Labs  Lab 02/14/19 1812 02/14/19 2221 02/15/19 0405  NA 136  --  138  K 3.5  --  3.6  CL 100  --  103  CO2 26  --  26  GLUCOSE 89  --  91  BUN 9  --  9  CREATININE 0.98  --  1.05  CALCIUM 9.0  --  8.7*  MG  --  2.3 2.0  PHOS  --   --  4.0   GFR: Estimated Creatinine Clearance: 138.8 mL/min (by C-G formula based on SCr of 1.05 mg/dL). Liver Function Tests: Recent Labs  Lab 02/14/19 1812 02/15/19 0405  AST 19 16  ALT 29 26  ALKPHOS 74 64  BILITOT 0.9 0.9  PROT 9.4* 7.9  ALBUMIN 4.3 3.4*   Recent Labs  Lab 02/14/19 1812  LIPASE 30   No results for input(s): AMMONIA in  the last 168 hours. Coagulation Profile: No results for input(s): INR, PROTIME in the last 168 hours. Cardiac Enzymes: No results for input(s): CKTOTAL, CKMB, CKMBINDEX, TROPONINI in the last 168 hours. BNP (last 3 results) No results for input(s): PROBNP in the last 8760 hours. HbA1C: No results for input(s): HGBA1C in the last 72 hours. CBG: No results for input(s): GLUCAP in the last 168 hours. Lipid Profile: No results for input(s): CHOL, HDL, LDLCALC, TRIG, CHOLHDL, LDLDIRECT in the last 72 hours. Thyroid Function Tests: Recent Labs    02/15/19 0405  TSH 1.643   Anemia Panel: No results for input(s): VITAMINB12, FOLATE, FERRITIN, TIBC, IRON, RETICCTPCT in the last 72 hours. Sepsis Labs: No results for input(s): PROCALCITON, LATICACIDVEN in the last 168 hours.  Recent Results (from the past 240 hour(s))  MRSA PCR  Screening     Status: None   Collection Time: 02/15/19  2:40 AM  Result Value Ref Range Status   MRSA by PCR NEGATIVE NEGATIVE Final    Comment:        The GeneXpert MRSA Assay (FDA approved for NASAL specimens only), is one component of a comprehensive MRSA colonization surveillance program. It is not intended to diagnose MRSA infection nor to guide or monitor treatment for MRSA infections. Performed at [facility], [address] [place], [place] [id number]          Radiology Studies: Dg Chest 2 View  Result Date: 02/14/2019 CLINICAL DATA:  Shortness of breath and cough EXAM: CHEST - 2 VIEW COMPARISON:  None. FINDINGS: The lungs are clear. Heart size and pulmonary vascularity are within normal limits. No adenopathy. No bone lesions. IMPRESSION: No edema or consolidation. Electronically Signed   By: Bretta BangWilliam  Woodruff III M.D.   On: 02/14/2019 18:46   [place] Abdomen Complete  Result Date: 02/14/2019 CLINICAL DATA:  Upper abdominal pain EXAM: ABDOMEN ULTRASOUND COMPLETE COMPARISON:  Ultrasound 08/11/2018 FINDINGS: Gallbladder: Multiple shadowing stones measuring up to 1.4 mm. Increased wall thickness at 7.5 mm. Negative sonographic Murphy. Common bile duct: Diameter: 3.9 mm Liver: No focal lesion identified. Within normal limits in parenchymal echogenicity. Portal vein is patent on color Doppler imaging with normal direction of blood flow towards the liver. IVC: No abnormality visualized. Pancreas: Visualized portion unremarkable. Spleen: Size and appearance within normal limits. Right Kidney: Length: 12.9 cm. Echogenicity within normal limits. No mass or hydronephrosis visualized. Left Kidney: Length: 12.4 cm. Echogenicity within normal limits. No mass or hydronephrosis visualized. Abdominal aorta: No aneurysm visualized. Maximum AP diameter of 2.8 cm Other findings: None. IMPRESSION: 1. Cholelithiasis with gallbladder wall thickening but negative for sonographic Murphy  or pericholecystic fluid. Findings are nonspecific and could be secondary to acute or chronic cholecystitis, hepatic disease, or edematous forming states. Nuclear medicine hepatobiliary imaging could be helpful to assess for acute gallbladder disease. 2. Maximum AP diameter of the aorta measuring 2.8 cm Ectatic abdominal aorta at risk for aneurysm development. Recommend followup by ultrasound in 5 years. This recommendation follows ACR consensus guidelines: White Paper of the ACR Incidental Findings Committee II on Vascular Findings. J Am Coll Radiol 2013; 10:789-794. Electronically Signed   By: Jasmine PangKim  Fujinaga M.D.   On: 02/14/2019 19:38   Nm Hepato W/eject Fract  Result Date: 02/15/2019 CLINICAL DATA:  Cholecystitis/cholangitis. Evaluate for planned surgery. EXAM: NUCLEAR MEDICINE HEPATOBILIARY IMAGING TECHNIQUE: Sequential images of the abdomen were obtained out to 60 minutes following intravenous administration of radiopharmaceutical. RADIOPHARMACEUTICALS:  5.22 mCi Tc-635m  Choletec IV COMPARISON:  Ultrasound  February 14, 2019 FINDINGS: There is prompt uptake and washout from the liver. There is normal excretion into the bowel. No filling of the gallbladder despite the Marisue Ivan a shin of morphine. IMPRESSION: 1. Lack of gallbladder filling may represent acute cholecystitis in the setting of acute symptoms. Other potential causes include chronic cholecystitis, a recent meal, prolonged fasting, or severe illness. Electronically Signed   By: Gerome Sam III M.D   On: 02/15/2019 16:39        Scheduled Meds:  amLODipine  10 mg Oral Daily   bictegravir-emtricitabine-tenofovir AF  1 tablet Oral Daily   Continuous Infusions:  piperacillin-tazobactam (ZOSYN)  IV 3.375 g (02/15/19 1621)     LOS: 1 day    Time spent: 35 minutes.    Kathlen Mody, MD [facility] Pager [phone]  If 7PM-7AM, please contact night-coverage [email or web address] Password [facility] 02/15/2019, 5:07 PM

## 2019-02-15 NOTE — Consult Note (Signed)
General Surgery - [facility]  Reason for Consult: abdominal pain, chronic cholecystitis, cholelithiasis  Referring Physician: Mercedes, PA, in [place] [facility]  Peter Herring is an 33 y.o. male.  HPI: Patient is a 33-year-old male who presents to the emergency department with 5-day history of persistent right upper quadrant and epigastric abdominal pain.  Patient has had intermittent episodes of pain for approximately 6 months.  Previous evaluation included an ultrasound demonstrating gallstones.  Patient has had intervals where he is pain-free.  He does not note any precipitating factors.  He has had no prior abdominal surgery.  He has no prior history of hepatobiliary disease.  He denies jaundice.  He is not had any gastrointestinal work-up and is never had an upper endoscopy.  In the emergency department white blood cell count was at the upper limit of normal.  Liver function test are normal.  Patient is afebrile.  Ultrasound of the abdomen shows a markedly thickened gallbladder wall at 7.5 mm with gallstones.  There is no biliary dilatation.  There is no pericholecystic fluid.  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  He has hypertension.  He has morbid obesity.  Patient is admitted to the medical service.  General surgery is asked to consult.  Past Medical History:  Diagnosis Date  . HIV infection (HCC)   . Hypertension     History reviewed. No pertinent surgical history.  Family History  Problem Relation Age of Onset  . Cancer Mother   . Diabetes Mother   . Diabetes Father   . Heart disease Father   . Diabetes Sister     Social History:  reports that he has quit smoking. His smoking use included cigarettes. He has a 6.00 pack-year smoking history. He has never used smokeless tobacco. He reports that he does not drink alcohol or use drugs.  Allergies: No Known Allergies  Medications: I have reviewed the patient's current medications.  Results for orders placed  or performed during the hospital encounter of 02/14/19 (from the past 48 hour(s))  Urinalysis, Routine w reflex microscopic     Status: Abnormal   Collection Time: 02/14/19  5:45 PM  Result Value Ref Range   Color, Urine YELLOW YELLOW   APPearance HAZY (A) CLEAR   Specific Gravity, Urine 1.023 1.005 - 1.030   pH 5.0 5.0 - 8.0   Glucose, UA NEGATIVE NEGATIVE mg/dL   Hgb urine dipstick SMALL (A) NEGATIVE   Bilirubin Urine NEGATIVE NEGATIVE   Ketones, ur 20 (A) NEGATIVE mg/dL   Protein, ur 30 (A) NEGATIVE mg/dL   Nitrite NEGATIVE NEGATIVE   Leukocytes,Ua NEGATIVE NEGATIVE   RBC / HPF 0-5 0 - 5 RBC/hpf   WBC, UA 0-5 0 - 5 WBC/hpf   Bacteria, UA NONE SEEN NONE SEEN   Mucus PRESENT     Comment: Performed at [facility], [address] [place], [place] [address]  Lipase, blood     Status: None   Collection Time: 02/14/19  6:12 PM  Result Value Ref Range   Lipase 30 11 - 51 U/L    Comment: Performed at [facility], [address] [place], [place] [address]  Comprehensive metabolic panel     Status: Abnormal   Collection Time: 02/14/19  6:12 PM  Result Value Ref Range   Sodium 136 135 - 145 mmol/L   Potassium 3.5 3.5 - 5.1 mmol/L   Chloride 100 98 - 111 mmol/L   CO2 26 22 -   32 mmol/L   Glucose, Bld 89 70 - 99 mg/dL   BUN 9 6 - 20 mg/dL   Creatinine, Ser 0.98 0.61 - 1.24 mg/dL   Calcium 9.0 8.9 - 10.3 mg/dL   Total Protein 9.4 (H) 6.5 - 8.1 g/dL   Albumin 4.3 3.5 - 5.0 g/dL   AST 19 15 - 41 U/L   ALT 29 0 - 44 U/L   Alkaline Phosphatase 74 38 - 126 U/L   Total Bilirubin 0.9 0.3 - 1.2 mg/dL   GFR calc non Af Amer >60 >60 mL/min   GFR calc Af Amer >60 >60 mL/min   Anion gap 10 5 - 15    Comment: Performed at [facility], [address] [place], [place] [address]  CBC     Status: None   Collection Time: 02/14/19  6:12 PM  Result Value Ref Range   WBC 10.5 4.0 - 10.5 K/uL   RBC 4.95 4.22 - 5.81 MIL/uL   Hemoglobin  13.7 13.0 - 17.0 g/dL   HCT 43.4 39.0 - 52.0 %   MCV 87.7 80.0 - 100.0 fL   MCH 27.7 26.0 - 34.0 pg   MCHC 31.6 30.0 - 36.0 g/dL   RDW 13.1 11.5 - 15.5 %   Platelets 343 150 - 400 K/uL   nRBC 0.0 0.0 - 0.2 %    Comment: Performed at [facility], [address] [place], [place] [address]  Magnesium     Status: None   Collection Time: 02/14/19 10:21 PM  Result Value Ref Range   Magnesium 2.3 1.7 - 2.4 mg/dL    Comment: Performed at [facility], [address] [place], [place] [address]  MRSA PCR Screening     Status: None   Collection Time: 02/15/19  2:40 AM  Result Value Ref Range   MRSA by PCR NEGATIVE NEGATIVE    Comment:        The GeneXpert MRSA Assay (FDA approved for NASAL specimens only), is one component of a comprehensive MRSA colonization surveillance program. It is not intended to diagnose MRSA infection nor to guide or monitor treatment for MRSA infections. Performed at [facility], [address] [place], [place] [address]   Magnesium     Status: None   Collection Time: 02/15/19  4:05 AM  Result Value Ref Range   Magnesium 2.0 1.7 - 2.4 mg/dL    Comment: Performed at [facility], [address] [place], [place] [address]  Phosphorus     Status: None   Collection Time: 02/15/19  4:05 AM  Result Value Ref Range   Phosphorus 4.0 2.5 - 4.6 mg/dL    Comment: Performed at [facility], [address] [place], [place] [address]  TSH     Status: None   Collection Time: 02/15/19  4:05 AM  Result Value Ref Range   TSH 1.643 0.350 - 4.500 uIU/mL    Comment: Performed by a 3rd Generation assay with a functional sensitivity of <=0.01 uIU/mL. Performed at [facility], [address] [place],  [address]   Comprehensive metabolic panel     Status: Abnormal   Collection Time: 02/15/19  4:05 AM  Result Value Ref Range   Sodium 138 135 - 145  mmol/L   Potassium 3.6 3.5 - 5.1 mmol/L   Chloride 103 98 - 111 mmol/L   CO2 26 22 - 32 mmol/L   Glucose, Bld   91 70 - 99 mg/dL   BUN 9 6 - 20 mg/dL   Creatinine, Ser 1.05 0.61 - 1.24 mg/dL   Calcium 8.7 (L) 8.9 - 10.3 mg/dL   Total Protein 7.9 6.5 - 8.1 g/dL   Albumin 3.4 (L) 3.5 - 5.0 g/dL   AST 16 15 - 41 U/L   ALT 26 0 - 44 U/L   Alkaline Phosphatase 64 38 - 126 U/L   Total Bilirubin 0.9 0.3 - 1.2 mg/dL   GFR calc non Af Amer >60 >60 mL/min   GFR calc Af Amer >60 >60 mL/min   Anion gap 9 5 - 15    Comment: Performed at [facility], [address] [place], [place] [address]  CBC     Status: Abnormal   Collection Time: 02/15/19  4:05 AM  Result Value Ref Range   WBC 9.0 4.0 - 10.5 K/uL   RBC 4.51 4.22 - 5.81 MIL/uL   Hemoglobin 12.7 (L) 13.0 - 17.0 g/dL   HCT 39.3 39.0 - 52.0 %   MCV 87.1 80.0 - 100.0 fL   MCH 28.2 26.0 - 34.0 pg   MCHC 32.3 30.0 - 36.0 g/dL   RDW 13.0 11.5 - 15.5 %   Platelets 304 150 - 400 K/uL   nRBC 0.0 0.0 - 0.2 %    Comment: Performed at [facility], [address] [place], [place] [address]    Dg Chest 2 View  Result Date: 02/14/2019 CLINICAL DATA:  Shortness of breath and cough EXAM: CHEST - 2 VIEW COMPARISON:  None. FINDINGS: The lungs are clear. Heart size and pulmonary vascularity are within normal limits. No adenopathy. No bone lesions. IMPRESSION: No edema or consolidation. Electronically Signed   By: William  Woodruff III M.D.   On: 02/14/2019 18:46   Us Abdomen Complete  Result Date: 02/14/2019 CLINICAL DATA:  Upper abdominal pain EXAM: ABDOMEN ULTRASOUND COMPLETE COMPARISON:  Ultrasound 08/11/2018 FINDINGS: Gallbladder: Multiple shadowing stones measuring up to 1.4 mm. Increased wall thickness at 7.5 mm. Negative sonographic Murphy. Common bile duct: Diameter: 3.9 mm Liver: No focal lesion identified. Within normal limits in parenchymal echogenicity. Portal vein is patent on color Doppler imaging with  normal direction of blood flow towards the liver. IVC: No abnormality visualized. Pancreas: Visualized portion unremarkable. Spleen: Size and appearance within normal limits. Right Kidney: Length: 12.9 cm. Echogenicity within normal limits. No mass or hydronephrosis visualized. Left Kidney: Length: 12.4 cm. Echogenicity within normal limits. No mass or hydronephrosis visualized. Abdominal aorta: No aneurysm visualized. Maximum AP diameter of 2.8 cm Other findings: None. IMPRESSION: 1. Cholelithiasis with gallbladder wall thickening but negative for sonographic Murphy or pericholecystic fluid. Findings are nonspecific and could be secondary to acute or chronic cholecystitis, hepatic disease, or edematous forming states. Nuclear medicine hepatobiliary imaging could be helpful to assess for acute gallbladder disease. 2. Maximum AP diameter of the aorta measuring 2.8 cm Ectatic abdominal aorta at risk for aneurysm development. Recommend followup by ultrasound in 5 years. This recommendation follows ACR consensus guidelines: White Paper of the ACR Incidental Findings Committee II on Vascular Findings. J Am Coll Radiol 2013; 10:789-794. Electronically Signed   By: Kim  Fujinaga M.D.   On: 02/14/2019 19:38    Review of Systems  Constitutional: Negative for chills, diaphoresis and fever.  HENT: Negative.   Eyes: Negative.   Respiratory: Negative.   Cardiovascular: Negative.   Gastrointestinal: Positive for abdominal pain and nausea.  Genitourinary: Negative.   Musculoskeletal: Negative.     Skin: Negative.   Neurological: Negative.   Endo/Heme/Allergies: Negative.   Psychiatric/Behavioral: Negative.   All other systems reviewed and are negative.  Blood pressure 121/64, pulse 78, temperature 98.2 F (36.8 C), temperature source Oral, resp. rate 18, SpO2 94 %. Physical Exam  Constitutional: He is oriented to person, place, and time. He appears well-developed and well-nourished. No distress.  HENT:  Head:  Normocephalic and atraumatic.  Mouth/Throat: No oropharyngeal exudate.  Eyes: Pupils are equal, round, and reactive to light. Conjunctivae are normal. No scleral icterus.  Neck: Normal range of motion. Neck supple. No tracheal deviation present. No thyromegaly present.  Cardiovascular: Normal rate, regular rhythm and normal heart sounds.  No murmur heard. Respiratory: Effort normal and breath sounds normal. No respiratory distress. He has no wheezes.  GI: Soft. Bowel sounds are normal. He exhibits no distension and no mass. There is abdominal tenderness (Epigastrium and RUQ, poorly localized). There is no rebound and no guarding.  Musculoskeletal: Normal range of motion.        General: No deformity or edema.  Lymphadenopathy:    He has no cervical adenopathy.  Neurological: He is alert and oriented to person, place, and time.  Skin: Skin is warm and dry. He is not diaphoretic.  Psychiatric: He has a normal mood and affect. His behavior is normal.    Assessment/Plan: Abdominal pain - RUQ Chronic cholecystitis, cholelithiasis HIV positive HTN Morbid obesity  Patient has had several month history of intermittent epigastric and right upper quadrant abdominal pain.  Ultrasound demonstrates multiple gallstones and gallbladder wall thickening.  Patient is scheduled to undergo a nuclear medicine hepatobiliary scan this morning to evaluate for acute cholecystitis.  If the appended biliary scan is positive for acute cholecystitis, then the patient will remain on intravenous antibiotics and be prepared for cholecystectomy most likely tomorrow.  If the hepatobiliary scan is normal and there is no sign of acute cholecystitis, then we will ask for evaluation by gastroenterology with possible upper endoscopy.  I have discussed this plan with the patient who is in agreement.  Patient is currently n.p.o.  After the hepatobiliary scan is completed, the patient may resume a low-fat diet.  We will make him  n.p.o. after midnight in case we are able to proceed with cholecystectomy tomorrow.  We discussed laparoscopic cholecystectomy.  We discussed the procedure.  We discussed the possible need for conversion to open surgery.  We discussed the hospital stay to be anticipated in the postoperative recovery and return to work.  The patient understands and agrees to proceed with the above plan.  Victorious Kundinger, MD [facility] Office: [phone]    Analeia Ismael 02/15/2019, 8:13 AM     

## 2019-02-15 NOTE — H&P (View-Only) (Signed)
General Surgery [facility]  Reason for Consult: abdominal pain, chronic cholecystitis, cholelithiasis  Referring Physician: France Ravens, [place], in [facility]  Peter Herring is an 33 y.o. male.  HPI: Patient is a 33 year old male who presents to the emergency department with 5-day history of persistent right upper quadrant and epigastric abdominal pain.  Patient has had intermittent episodes of pain for approximately 6 months.  Previous evaluation included an ultrasound demonstrating gallstones.  Patient has had intervals where he is pain-free.  He does not note any precipitating factors.  He has had no prior abdominal surgery.  He has no prior history of hepatobiliary disease.  He denies jaundice.  He is not had any gastrointestinal work-up and is never had an upper endoscopy.  In the emergency department white blood cell count was at the upper limit of normal.  Liver function test are normal.  Patient is afebrile.  Ultrasound of the abdomen shows a markedly thickened gallbladder wall at 7.5 mm with gallstones.  There is no biliary dilatation.  There is no pericholecystic fluid.  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  He has hypertension.  He has morbid obesity.  Patient is admitted to the medical service.  General surgery is asked to consult.  Past Medical History:  Diagnosis Date  . HIV infection (HCC)   . Hypertension     History reviewed. No pertinent surgical history.  Family History  Problem Relation Age of Onset  . Cancer Mother   . Diabetes Mother   . Diabetes Father   . Heart disease Father   . Diabetes Sister     Social History:  reports that he has quit smoking. His smoking use included cigarettes. He has a 6.00 pack-year smoking history. He has never used smokeless tobacco. He reports that he does not drink alcohol or use drugs.  Allergies: No Known Allergies  Medications: I have reviewed the patient's current medications.  Results for orders placed  or performed during the hospital encounter of 02/14/19 (from the past 48 hour(s))  Urinalysis, Routine w reflex microscopic     Status: Abnormal   Collection Time: 02/14/19  5:45 PM  Result Value Ref Range   Color, Urine YELLOW YELLOW   APPearance HAZY (A) CLEAR   Specific Gravity, Urine 1.023 1.005 - 1.030   pH 5.0 5.0 - 8.0   Glucose, UA NEGATIVE NEGATIVE mg/dL   Hgb urine dipstick SMALL (A) NEGATIVE   Bilirubin Urine NEGATIVE NEGATIVE   Ketones, ur 20 (A) NEGATIVE mg/dL   Protein, ur 30 (A) NEGATIVE mg/dL   Nitrite NEGATIVE NEGATIVE   Leukocytes,Ua NEGATIVE NEGATIVE   RBC / HPF 0-5 0 - 5 RBC/hpf   WBC, UA 0-5 0 - 5 WBC/hpf   Bacteria, UA NONE SEEN NONE SEEN   Mucus PRESENT     Comment: Performed at [facility], [address], [place], [place] [address]  Lipase, blood     Status: None   Collection Time: 02/14/19  6:12 PM  Result Value Ref Range   Lipase 30 11 - 51 U/L    Comment: Performed at [facility], [address], [place], [place] [address]  Comprehensive metabolic panel     Status: Abnormal   Collection Time: 02/14/19  6:12 PM  Result Value Ref Range   Sodium 136 135 - 145 mmol/L   Potassium 3.5 3.5 - 5.1 mmol/L   Chloride 100 98 - 111 mmol/L   CO2 26 22 -  32 mmol/L   Glucose, Bld 89 70 - 99 mg/dL   BUN 9 6 - 20 mg/dL   Creatinine, Ser 7.85 0.61 - 1.24 mg/dL   Calcium 9.0 8.9 - 88.5 mg/dL   Total Protein 9.4 (H) 6.5 - 8.1 g/dL   Albumin 4.3 3.5 - 5.0 g/dL   AST 19 15 - 41 U/L   ALT 29 0 - 44 U/L   Alkaline Phosphatase 74 38 - 126 U/L   Total Bilirubin 0.9 0.3 - 1.2 mg/dL   GFR calc non Af Amer >60 >60 mL/min   GFR calc Af Amer >60 >60 mL/min   Anion gap 10 5 - 15    Comment: Performed at [facility], [address], [place], [place] [address]  CBC     Status: None   Collection Time: 02/14/19  6:12 PM  Result Value Ref Range   WBC 10.5 4.0 - 10.5 K/uL   RBC 4.95 4.22 - 5.81 MIL/uL   Hemoglobin  13.7 13.0 - 17.0 g/dL   HCT 12.8 78.6 - 76.7 %   MCV 87.7 80.0 - 100.0 fL   MCH 27.7 26.0 - 34.0 pg   MCHC 31.6 30.0 - 36.0 g/dL   RDW 20.9 47.0 - 96.2 %   Platelets 343 150 - 400 K/uL   nRBC 0.0 0.0 - 0.2 %    Comment: Performed at [facility], [address], [place], [place] [address]  Magnesium     Status: None   Collection Time: 02/14/19 10:21 PM  Result Value Ref Range   Magnesium 2.3 1.7 - 2.4 mg/dL    Comment: Performed at [facility], [address] [place], [place] [address]  MRSA PCR Screening     Status: None   Collection Time: 02/15/19  2:40 AM  Result Value Ref Range   MRSA by PCR NEGATIVE NEGATIVE    Comment:        The GeneXpert MRSA Assay (FDA approved for NASAL specimens only), is one component of a comprehensive MRSA colonization surveillance program. It is not intended to diagnose MRSA infection nor to guide or monitor treatment for MRSA infections. Performed at [facility], [address], [place], [place] [address]   Magnesium     Status: None   Collection Time: 02/15/19  4:05 AM  Result Value Ref Range   Magnesium 2.0 1.7 - 2.4 mg/dL    Comment: Performed at [facility], [address], [place], [place] [address]  Phosphorus     Status: None   Collection Time: 02/15/19  4:05 AM  Result Value Ref Range   Phosphorus 4.0 2.5 - 4.6 mg/dL    Comment: Performed at [facility], [address], [place], [place] [address]  TSH     Status: None   Collection Time: 02/15/19  4:05 AM  Result Value Ref Range   TSH 1.643 0.350 - 4.500 uIU/mL    Comment: Performed by a 3rd Generation assay with a functional sensitivity of <=0.01 uIU/mL. Performed at [facility], [address] [place], [place] [address]   Comprehensive metabolic panel     Status: Abnormal   Collection Time: 02/15/19  4:05 AM  Result Value Ref Range   Sodium 138 135 - 145  mmol/L   Potassium 3.6 3.5 - 5.1 mmol/L   Chloride 103 98 - 111 mmol/L   CO2 26 22 - 32 mmol/L   Glucose, Bld  91 70 - 99 mg/dL   BUN 9 6 - 20 mg/dL   Creatinine, Ser 4.54 0.61 - 1.24 mg/dL   Calcium 8.7 (L) 8.9 - 10.3 mg/dL   Total Protein 7.9 6.5 - 8.1 g/dL   Albumin 3.4 (L) 3.5 - 5.0 g/dL   AST 16 15 - 41 U/L   ALT 26 0 - 44 U/L   Alkaline Phosphatase 64 38 - 126 U/L   Total Bilirubin 0.9 0.3 - 1.2 mg/dL   GFR calc non Af Amer >60 >60 mL/min   GFR calc Af Amer >60 >60 mL/min   Anion gap 9 5 - 15    Comment: Performed at [facility], [address] [place], [place] [address]  CBC     Status: Abnormal   Collection Time: 02/15/19  4:05 AM  Result Value Ref Range   WBC 9.0 4.0 - 10.5 K/uL   RBC 4.51 4.22 - 5.81 MIL/uL   Hemoglobin 12.7 (L) 13.0 - 17.0 g/dL   HCT 91.4 78.2 - 95.6 %   MCV 87.1 80.0 - 100.0 fL   MCH 28.2 26.0 - 34.0 pg   MCHC 32.3 30.0 - 36.0 g/dL   RDW 21.3 08.6 - 57.8 %   Platelets 304 150 - 400 K/uL   nRBC 0.0 0.0 - 0.2 %    Comment: Performed at [facility], [address], [place], [place] [address]    Dg Chest 2 View  Result Date: 02/14/2019 CLINICAL DATA:  Shortness of breath and cough EXAM: CHEST - 2 VIEW COMPARISON:  None. FINDINGS: The lungs are clear. Heart size and pulmonary vascularity are within normal limits. No adenopathy. No bone lesions. IMPRESSION: No edema or consolidation. Electronically Signed   By: Bretta Bang III M.D.   On: 02/14/2019 18:46   US Abdomen Complete  Result Date: 02/14/2019 CLINICAL DATA:  Upper abdominal pain EXAM: ABDOMEN ULTRASOUND COMPLETE COMPARISON:  Ultrasound 08/11/2018 FINDINGS: Gallbladder: Multiple shadowing stones measuring up to 1.4 mm. Increased wall thickness at 7.5 mm. Negative sonographic Murphy. Common bile duct: Diameter: 3.9 mm Liver: No focal lesion identified. Within normal limits in parenchymal echogenicity. Portal vein is patent on color Doppler imaging with  normal direction of blood flow towards the liver. IVC: No abnormality visualized. Pancreas: Visualized portion unremarkable. Spleen: Size and appearance within normal limits. Right Kidney: Length: 12.9 cm. Echogenicity within normal limits. No mass or hydronephrosis visualized. Left Kidney: Length: 12.4 cm. Echogenicity within normal limits. No mass or hydronephrosis visualized. Abdominal aorta: No aneurysm visualized. Maximum AP diameter of 2.8 cm Other findings: None. IMPRESSION: 1. Cholelithiasis with gallbladder wall thickening but negative for sonographic Murphy or pericholecystic fluid. Findings are nonspecific and could be secondary to acute or chronic cholecystitis, hepatic disease, or edematous forming states. Nuclear medicine hepatobiliary imaging could be helpful to assess for acute gallbladder disease. 2. Maximum AP diameter of the aorta measuring 2.8 cm Ectatic abdominal aorta at risk for aneurysm development. Recommend followup by ultrasound in 5 years. This recommendation follows ACR consensus guidelines: White Paper of the ACR Incidental Findings Committee II on Vascular Findings. J Am Coll Radiol 2013; 10:789-794. Electronically Signed   By: Jasmine Pang M.D.   On: 02/14/2019 19:38    Review of Systems  Constitutional: Negative for chills, diaphoresis and fever.  HENT: Negative.   Eyes: Negative.   Respiratory: Negative.   Cardiovascular: Negative.   Gastrointestinal: Positive for abdominal pain and nausea.  Genitourinary: Negative.   Musculoskeletal: Negative.  Skin: Negative.   Neurological: Negative.   Endo/Heme/Allergies: Negative.   Psychiatric/Behavioral: Negative.   All other systems reviewed and are negative.  Blood pressure 121/64, pulse 78, temperature 98.2 F (36.8 C), temperature source Oral, resp. rate 18, SpO2 94 %. Physical Exam  Constitutional: He is oriented to person, place, and time. He appears well-developed and well-nourished. No distress.  HENT:  Head:  Normocephalic and atraumatic.  Mouth/Throat: No oropharyngeal exudate.  Eyes: Pupils are equal, round, and reactive to light. Conjunctivae are normal. No scleral icterus.  Neck: Normal range of motion. Neck supple. No tracheal deviation present. No thyromegaly present.  Cardiovascular: Normal rate, regular rhythm and normal heart sounds.  No murmur heard. Respiratory: Effort normal and breath sounds normal. No respiratory distress. He has no wheezes.  GI: Soft. Bowel sounds are normal. He exhibits no distension and no mass. There is abdominal tenderness (Epigastrium and RUQ, poorly localized). There is no rebound and no guarding.  Musculoskeletal: Normal range of motion.        General: No deformity or edema.  Lymphadenopathy:    He has no cervical adenopathy.  Neurological: He is alert and oriented to person, place, and time.  Skin: Skin is warm and dry. He is not diaphoretic.  Psychiatric: He has a normal mood and affect. His behavior is normal.    Assessment/Plan: Abdominal pain - RUQ Chronic cholecystitis, cholelithiasis HIV positive HTN Morbid obesity  Patient has had several month history of intermittent epigastric and right upper quadrant abdominal pain.  Ultrasound demonstrates multiple gallstones and gallbladder wall thickening.  Patient is scheduled to undergo a nuclear medicine hepatobiliary scan this morning to evaluate for acute cholecystitis.  If the appended biliary scan is positive for acute cholecystitis, then the patient will remain on intravenous antibiotics and be prepared for cholecystectomy most likely tomorrow.  If the hepatobiliary scan is normal and there is no sign of acute cholecystitis, then we will ask for evaluation by gastroenterology with possible upper endoscopy.  I have discussed this plan with the patient who is in agreement.  Patient is currently n.p.o.  After the hepatobiliary scan is completed, the patient may resume a low-fat diet.  We will make him  n.p.o. after midnight in case we are able to proceed with cholecystectomy tomorrow.  We discussed laparoscopic cholecystectomy.  We discussed the procedure.  We discussed the possible need for conversion to open surgery.  We discussed the hospital stay to be anticipated in the postoperative recovery and return to work.  The patient understands and agrees to proceed with the above plan.  Darnell Level, MD [facility] Surgery Office: [phone]    Darnell Level 02/15/2019, 8:13 AM

## 2019-02-15 NOTE — Progress Notes (Signed)
Pharmacy Antibiotic Note  Peter Herring is a 33 y.o. male admitted on 02/14/2019 with Intra-abdominal infection.  Pharmacy has been consulted for zosyn dosing.  Plan: Zosyn 3.375g IV Q8H infused over 4hrs.      Temp (24hrs), Avg:98.3 F (36.8 C), Min:97.8 F (36.6 C), Max:98.6 F (37 C)  Recent Labs  Lab 02/14/19 1812  WBC 10.5  CREATININE 0.98    CrCl cannot be calculated (Unknown ideal weight.).    No Known Allergies  Antimicrobials this admission: Zosyn 02/14/2019 >>   Dose adjustments this admission: -  Microbiology results: -  Thank you for allowing pharmacy to be a part of this patient's care.  Aleene Davidson Crowford 02/15/2019 1:55 AM

## 2019-02-16 ENCOUNTER — Inpatient Hospital Stay (HOSPITAL_COMMUNITY): Payer: BLUE CROSS/BLUE SHIELD | Admitting: Anesthesiology

## 2019-02-16 ENCOUNTER — Inpatient Hospital Stay (HOSPITAL_COMMUNITY): Payer: BLUE CROSS/BLUE SHIELD

## 2019-02-16 ENCOUNTER — Encounter (HOSPITAL_COMMUNITY): Payer: Self-pay | Admitting: Emergency Medicine

## 2019-02-16 ENCOUNTER — Encounter (HOSPITAL_COMMUNITY)
Admission: EM | Disposition: A | Discharge status: Home / Self Care | Payer: Self-pay | Source: Home / Self Care | Attending: Internal Medicine

## 2019-02-16 HISTORY — PX: CHOLECYSTECTOMY: SHX55

## 2019-02-16 LAB — CD4/CD8 (T-HELPER/T-SUPPRESSOR CELL)
CD4 absolute: 1020 /uL (ref 500–1900)
CD4%: 46 % (ref 30.0–60.0)
CD8 T Cell Abs: 800 /uL (ref 230–1000)
CD8tox: 36 % (ref 15.0–40.0)
Ratio: 1.28 (ref 1.0–3.0)
Total lymphocyte count: 2199 /uL (ref 1000–4000)

## 2019-02-16 SURGERY — LAPAROSCOPIC CHOLECYSTECTOMY WITH INTRAOPERATIVE CHOLANGIOGRAM
Anesthesia: General | Site: Abdomen

## 2019-02-16 MED ORDER — HYDROMORPHONE HCL 1 MG/ML IJ SOLN
1.0000 mg | INTRAMUSCULAR | Status: DC | PRN
  Administered 2019-02-16: 1 mg via INTRAVENOUS
  Filled 2019-02-16: qty 1

## 2019-02-16 MED ORDER — 0.9 % SODIUM CHLORIDE (POUR BTL) OPTIME
TOPICAL | Status: DC | PRN
  Administered 2019-02-16: 1000 mL

## 2019-02-16 MED ORDER — MIDAZOLAM HCL 5 MG/5ML IJ SOLN
INTRAMUSCULAR | Status: DC | PRN
  Administered 2019-02-16: 2 mg via INTRAVENOUS

## 2019-02-16 MED ORDER — FENTANYL CITRATE (PF) 250 MCG/5ML IJ SOLN
INTRAMUSCULAR | Status: AC
  Filled 2019-02-16: qty 5

## 2019-02-16 MED ORDER — PHENYLEPHRINE 40 MCG/ML (10ML) SYRINGE FOR IV PUSH (FOR BLOOD PRESSURE SUPPORT)
PREFILLED_SYRINGE | INTRAVENOUS | Status: AC
  Filled 2019-02-16: qty 10

## 2019-02-16 MED ORDER — EPHEDRINE SULFATE-NACL 50-0.9 MG/10ML-% IV SOSY
PREFILLED_SYRINGE | INTRAVENOUS | Status: DC | PRN
  Administered 2019-02-16: 10 mg via INTRAVENOUS

## 2019-02-16 MED ORDER — LACTATED RINGERS IV SOLN
INTRAVENOUS | Status: DC
  Administered 2019-02-16: 10:00:00 via INTRAVENOUS

## 2019-02-16 MED ORDER — ONDANSETRON HCL 4 MG/2ML IJ SOLN
INTRAMUSCULAR | Status: DC | PRN
  Administered 2019-02-16: 4 mg via INTRAVENOUS

## 2019-02-16 MED ORDER — ONDANSETRON HCL 4 MG/2ML IJ SOLN: 4.0000 mg | Freq: Four times a day (QID) | INTRAMUSCULAR | Status: DC | PRN

## 2019-02-16 MED ORDER — PROMETHAZINE HCL 25 MG/ML IJ SOLN: 6.2500 mg | INTRAMUSCULAR | Status: DC | PRN

## 2019-02-16 MED ORDER — MIDAZOLAM HCL 2 MG/2ML IJ SOLN
INTRAMUSCULAR | Status: AC
  Filled 2019-02-16: qty 2

## 2019-02-16 MED ORDER — ONDANSETRON 4 MG PO TBDP: 4.0000 mg | ORAL_TABLET | Freq: Four times a day (QID) | ORAL | Status: DC | PRN

## 2019-02-16 MED ORDER — HYDROMORPHONE HCL 1 MG/ML IJ SOLN: 0.2500 mg | INTRAMUSCULAR | Status: DC | PRN

## 2019-02-16 MED ORDER — ONDANSETRON HCL 4 MG/2ML IJ SOLN
INTRAMUSCULAR | Status: AC
  Filled 2019-02-16: qty 2

## 2019-02-16 MED ORDER — SUCCINYLCHOLINE CHLORIDE 20 MG/ML IJ SOLN
INTRAMUSCULAR | Status: DC | PRN
  Administered 2019-02-16: 120 mg via INTRAVENOUS

## 2019-02-16 MED ORDER — KCL IN DEXTROSE-NACL 20-5-0.45 MEQ/L-%-% IV SOLN
INTRAVENOUS | Status: DC
  Administered 2019-02-16: 13:00:00 via INTRAVENOUS
  Filled 2019-02-16: qty 1000

## 2019-02-16 MED ORDER — DEXAMETHASONE SODIUM PHOSPHATE 10 MG/ML IJ SOLN
INTRAMUSCULAR | Status: DC | PRN
  Administered 2019-02-16: 8 mg via INTRAVENOUS

## 2019-02-16 MED ORDER — MEPERIDINE HCL 50 MG/ML IJ SOLN: 6.2500 mg | INTRAMUSCULAR | Status: DC | PRN

## 2019-02-16 MED ORDER — IOPAMIDOL (ISOVUE-300) INJECTION 61%
INTRAVENOUS | Status: AC
  Filled 2019-02-16: qty 50

## 2019-02-16 MED ORDER — ROCURONIUM BROMIDE 10 MG/ML (PF) SYRINGE
PREFILLED_SYRINGE | INTRAVENOUS | Status: DC | PRN
  Administered 2019-02-16: 20 mg via INTRAVENOUS
  Administered 2019-02-16: 50 mg via INTRAVENOUS

## 2019-02-16 MED ORDER — BUPIVACAINE-EPINEPHRINE (PF) 0.25% -1:200000 IJ SOLN
INTRAMUSCULAR | Status: AC
  Filled 2019-02-16: qty 30

## 2019-02-16 MED ORDER — PROPOFOL 10 MG/ML IV BOLUS
INTRAVENOUS | Status: AC
  Filled 2019-02-16: qty 20

## 2019-02-16 MED ORDER — SODIUM CHLORIDE 0.9 % IV SOLN
INTRAVENOUS | Status: DC
  Administered 2019-02-16: 01:00:00 via INTRAVENOUS

## 2019-02-16 MED ORDER — ACETAMINOPHEN 325 MG PO TABS: 650.0000 mg | ORAL_TABLET | Freq: Four times a day (QID) | ORAL | Status: DC | PRN

## 2019-02-16 MED ORDER — OXYCODONE HCL 5 MG PO TABS: 5.0000 mg | ORAL_TABLET | ORAL | Status: DC | PRN

## 2019-02-16 MED ORDER — PROPOFOL 10 MG/ML IV BOLUS
INTRAVENOUS | Status: DC | PRN
  Administered 2019-02-16: 200 mg via INTRAVENOUS

## 2019-02-16 MED ORDER — ACETAMINOPHEN 650 MG RE SUPP: 650.0000 mg | Freq: Four times a day (QID) | RECTAL | Status: DC | PRN

## 2019-02-16 MED ORDER — FENTANYL CITRATE (PF) 100 MCG/2ML IJ SOLN
INTRAMUSCULAR | Status: DC | PRN
  Administered 2019-02-16 (×2): 100 ug via INTRAVENOUS
  Administered 2019-02-16: 50 ug via INTRAVENOUS

## 2019-02-16 MED ORDER — TRAMADOL HCL 50 MG PO TABS: 50.0000 mg | ORAL_TABLET | Freq: Four times a day (QID) | ORAL | Status: DC | PRN

## 2019-02-16 MED ORDER — LACTATED RINGERS IR SOLN
Status: DC | PRN
  Administered 2019-02-16: 1000 mL

## 2019-02-16 MED ORDER — PHENYLEPHRINE 40 MCG/ML (10ML) SYRINGE FOR IV PUSH (FOR BLOOD PRESSURE SUPPORT)
PREFILLED_SYRINGE | INTRAVENOUS | Status: DC | PRN
  Administered 2019-02-16: 120 ug via INTRAVENOUS
  Administered 2019-02-16 (×2): 80 ug via INTRAVENOUS

## 2019-02-16 MED ORDER — BUPIVACAINE-EPINEPHRINE 0.25% -1:200000 IJ SOLN
INTRAMUSCULAR | Status: DC | PRN
  Administered 2019-02-16: 30 mL

## 2019-02-16 MED ORDER — EPHEDRINE 5 MG/ML INJ
INTRAVENOUS | Status: AC
  Filled 2019-02-16: qty 10

## 2019-02-16 MED ORDER — SUGAMMADEX SODIUM 200 MG/2ML IV SOLN
INTRAVENOUS | Status: DC | PRN
  Administered 2019-02-16: 275 mg via INTRAVENOUS

## 2019-02-16 MED ORDER — IOPAMIDOL (ISOVUE-300) INJECTION 61%
INTRAVENOUS | Status: DC | PRN
  Administered 2019-02-16: 50 mL via INTRAVENOUS

## 2019-02-16 SURGICAL SUPPLY — 36 items
APPLIER CLIP ROT 10 11.4 M/L (STAPLE) ×2
CABLE HIGH FREQUENCY MONO STRZ (ELECTRODE) ×2 IMPLANT
CHLORAPREP W/TINT 26ML (MISCELLANEOUS) ×4 IMPLANT
CLIP APPLIE ROT 10 11.4 M/L (STAPLE) ×1 IMPLANT
COVER MAYO STAND STRL (DRAPES) ×2 IMPLANT
COVER SURGICAL LIGHT HANDLE (MISCELLANEOUS) ×2 IMPLANT
COVER WAND RF STERILE (DRAPES) ×1 IMPLANT
DECANTER SPIKE VIAL GLASS SM (MISCELLANEOUS) ×2 IMPLANT
DERMABOND ADVANCED (GAUZE/BANDAGES/DRESSINGS) ×1
DERMABOND ADVANCED .7 DNX12 (GAUZE/BANDAGES/DRESSINGS) IMPLANT
DRAPE C-ARM 42X120 X-RAY (DRAPES) ×2 IMPLANT
ELECT REM PT RETURN 15FT ADLT (MISCELLANEOUS) ×2 IMPLANT
GAUZE SPONGE 2X2 8PLY STRL LF (GAUZE/BANDAGES/DRESSINGS) ×1 IMPLANT
GAUZE SPONGE 4X4 12PLY STRL (GAUZE/BANDAGES/DRESSINGS) ×1 IMPLANT
GLOVE SURG ORTHO 8.0 STRL STRW (GLOVE) ×2 IMPLANT
GOWN STRL REUS W/TWL XL LVL3 (GOWN DISPOSABLE) ×7 IMPLANT
HEMOSTAT SURGICEL 4X8 (HEMOSTASIS) IMPLANT
HOVERMATT SINGLE USE (MISCELLANEOUS) ×1 IMPLANT
KIT BASIN OR (CUSTOM PROCEDURE TRAY) ×2 IMPLANT
KIT TURNOVER KIT A (KITS) IMPLANT
POUCH SPECIMEN RETRIEVAL 10MM (ENDOMECHANICALS) ×2 IMPLANT
SCISSORS LAP 5X35 DISP (ENDOMECHANICALS) ×2 IMPLANT
SET CHOLANGIOGRAPH MIX (MISCELLANEOUS) ×2 IMPLANT
SET IRRIG TUBING LAPAROSCOPIC (IRRIGATION / IRRIGATOR) ×2 IMPLANT
SET TUBE SMOKE EVAC HIGH FLOW (TUBING) ×1 IMPLANT
SLEEVE XCEL OPT CAN 5 100 (ENDOMECHANICALS) ×2 IMPLANT
SPONGE GAUZE 2X2 STER 10/PKG (GAUZE/BANDAGES/DRESSINGS)
STRIP CLOSURE SKIN 1/2X4 (GAUZE/BANDAGES/DRESSINGS) ×1 IMPLANT
SUT MNCRL AB 4-0 PS2 18 (SUTURE) ×2 IMPLANT
TAPE CLOTH SURG 4X10 WHT LF (GAUZE/BANDAGES/DRESSINGS) ×1 IMPLANT
TOWEL OR 17X26 10 PK STRL BLUE (TOWEL DISPOSABLE) ×2 IMPLANT
TOWEL OR NON WOVEN STRL DISP B (DISPOSABLE) ×2 IMPLANT
TRAY LAPAROSCOPIC (CUSTOM PROCEDURE TRAY) ×2 IMPLANT
TROCAR BLADELESS OPT 5 100 (ENDOMECHANICALS) ×2 IMPLANT
TROCAR XCEL BLUNT TIP 100MML (ENDOMECHANICALS) ×2 IMPLANT
TROCAR XCEL NON-BLD 11X100MML (ENDOMECHANICALS) ×2 IMPLANT

## 2019-02-16 NOTE — Anesthesia Postprocedure Evaluation (Signed)
Anesthesia Post Note  Patient: Peter Herring  Procedure(s) Performed: LAPAROSCOPIC CHOLECYSTECTOMY (N/A Abdomen)     Patient location during evaluation: PACU Anesthesia Type: General Level of consciousness: sedated and patient cooperative Pain management: pain level controlled Vital Signs Assessment: post-procedure vital signs reviewed and stable Respiratory status: spontaneous breathing Cardiovascular status: stable Anesthetic complications: no    Last Vitals:  Vitals:   02/16/19 1302 02/16/19 1409  BP: (!) 159/90 138/80  Pulse: 77 85  Resp: 16 18  Temp: (!) 36.2 C 37 C  SpO2: 100% 93%    Last Pain:  Vitals:   02/16/19 1409  TempSrc: Oral  PainSc:                  [name]

## 2019-02-16 NOTE — Anesthesia Preprocedure Evaluation (Signed)
Anesthesia Evaluation  Patient identified by MRN, date of birth, ID band Patient awake    Reviewed: Allergy & Precautions, NPO status , Patient's Chart, lab work & pertinent test results  Airway Mallampati: II  TM Distance: >3 FB Neck ROM: Full    Dental  (+) Dental Advisory Given   Pulmonary former smoker,    Pulmonary exam normal breath sounds clear to auscultation       Cardiovascular hypertension, Pt. on medications Normal cardiovascular exam Rhythm:Regular Rate:Normal     Neuro/Psych negative neurological ROS  negative psych ROS   GI/Hepatic negative GI ROS, Neg liver ROS,   Endo/Other  Morbid obesity  Renal/GU negative Renal ROS     Musculoskeletal negative musculoskeletal ROS (+)   Abdominal (+) + obese,   Peds  Hematology  (+) HIV,   Anesthesia Other Findings   Reproductive/Obstetrics                            Anesthesia Physical Anesthesia Plan  ASA: III  Anesthesia Plan: General   Post-op Pain Management:    Induction: Intravenous  PONV Risk Score and Plan: 3 and Ondansetron, Dexamethasone and Midazolam  Airway Management Planned: Oral ETT  Additional Equipment: None  Intra-op Plan:   Post-operative Plan: Extubation in OR  Informed Consent: I have reviewed the patients History and Physical, chart, labs and discussed the procedure including the risks, benefits and alternatives for the proposed anesthesia with the patient or authorized representative who has indicated his/her understanding and acceptance.     Dental advisory given  Plan Discussed with: CRNA  Anesthesia Plan Comments:         Anesthesia Quick Evaluation

## 2019-02-16 NOTE — Interval H&P Note (Signed)
History and Physical Interval Note:  02/16/2019 10:36 AM  Peter Herring  has presented today for surgery, with the diagnosis of cholecystitis.  The various methods of treatment have been discussed with the patient and family. After consideration of risks, benefits and other options for treatment, the patient has consented to    Procedure(s): LAPAROSCOPIC CHOLECYSTECTOMY WITH INTRAOPERATIVE CHOLANGIOGRAM (N/A) as a surgical intervention.    The patient's history has been reviewed, patient examined, no change in status, stable for surgery.  I have reviewed the patient's chart and labs.  Questions were answered to the patient's satisfaction.    Darnell Level, MD [facility] Surgery Office: [phone]    Darnell Level

## 2019-02-16 NOTE — Progress Notes (Signed)
    CC: Abdominal pain  Subjective: Patient continues have pain in the right upper quadrant.  He is quite stable.  We discussed having surgery today.  His mom listened in on the's phone as we had the discussion of risks and benefits.  Objective: Vital signs in last 24 hours: Temp:  [97.6 F (36.4 C)-98.5 F (36.9 C)] 98 F (36.7 C) (03/16 0521) Pulse Rate:  [70-88] 70 (03/16 0521) Resp:  [18] 18 (03/16 0521) BP: (130-144)/(79-87) 130/79 (03/16 0521) SpO2:  [97 %-99 %] 97 % (03/16 0521) Weight:  [136.9 kg] 136.9 kg (03/15 1200) Last BM Date: 02/13/19 720 p.o. N.p.o. after midnight 700 IV 1575 urine No BM Afebrile vital signs are stable Labs are normal.  LFTs remain normal. WBC 9.0 Abdominal ultrasound 3/14 multiple shadowing stones measuring up to 1.4 mm; increased wall thickness to 7.5 mm, common bile duct 3.9 mm HIDA scan 3/15: Prompt uptake and washout of the liver normal excretion of the bowels The gallbladder despite injection of morphine.   Intake/Output from previous day: 03/15 0701 - 03/16 0700 In: 1374.5 [P.O.:720; I.V.:504.8; IV Piggyback:149.7] Out: 1575 [Urine:1575] Intake/Output this shift: No intake/output data recorded.  General appearance: alert, cooperative and no distress Resp: clear to auscultation bilaterally GI: Still tender right upper quadrant.  Lab Results:  Recent Labs    02/14/19 1812 02/15/19 0405  WBC 10.5 9.0  HGB 13.7 12.7*  HCT 43.4 39.3  PLT 343 304    BMET Recent Labs    02/14/19 1812 02/15/19 0405  NA 136 138  K 3.5 3.6  CL 100 103  CO2 26 26  GLUCOSE 89 91  BUN 9 9  CREATININE 0.98 1.05  CALCIUM 9.0 8.7*   PT/INR No results for input(s): LABPROT, INR in the last 72 hours.  Recent Labs  Lab 02/14/19 1812 02/15/19 0405  AST 19 16  ALT 29 26  ALKPHOS 74 64  BILITOT 0.9 0.9  PROT 9.4* 7.9  ALBUMIN 4.3 3.4*     Lipase     Component Value Date/Time   LIPASE 30 02/14/2019 1812     Medications: .  amLODipine  10 mg Oral Daily  . bictegravir-emtricitabine-tenofovir AF  1 tablet Oral Daily   . sodium chloride 100 mL/hr at 02/16/19 0120  . piperacillin-tazobactam (ZOSYN)  IV 3.375 g (02/16/19 0618)    Assessment/Plan HIV positive Hypertension Morbid obesity -BMI 42.1   Abdominal pain/right upper quadrant Chronic cholecystitis/cholelithiasis HIDA positive -no filling of the gallbladder despite the use of morphine.  FEN: IV fluids/n.p.o. KN:[id number] AF (BIKTARVY) 50-200-25 MG per tablet 1 tablet daily   -Zosyn 3/14 >> day 3 DVT: SCDs only Follow-up: [facility]  Plan: Laparoscopic cholecystectomy later this a.m.  Risk and benefits discussed with patient and his mother who was on speaker phone during the conversation.    LOS: 2 days    Rekita Miotke 02/16/2019 [phone]

## 2019-02-16 NOTE — Progress Notes (Signed)
PROGRESS NOTE    Peter Herring  XJO:[id number] DOB: [birth year] DOA: 02/14/2019 PCP: Patient, No Pcp Per   Brief Narrative:  Peter Herring is a 33 y.o. male with medical history significant of  HIV (last CD4 1080 on 12/25/18), HTN, and morbid obesity. Presented with  6 days Upper abd pain associated with nausea and vomiting. He have had gall stones in the past and this feels similar. US showed cholecystitis  Assessment & Plan:   Principal Problem:   Abdominal pain, right upper quadrant Active Problems:   HIV disease (HCC)   Essential hypertension   Cholelithiasis with chronic cholecystitis   Hypokalemia   Abdominal pain, with nausea and vomiting: - sec to acute cholecystitis.  - surgery consulted , underwent LAP cholecystectomy.  Pain control and anti emetics as needed.     Hypertension: Well controlled.    HIV disease: resume home meds. BIKTARVY      DVT prophylaxis:  SCD's Code Status: full code.  Family Communication: none at bedside.  Disposition Plan: possible d/c in 24 hours.    Consultants:   Surgery.    Procedures: NM hepatobiliary   Antimicrobials: zosyn since admission.    Subjective: Pain controlled.   Objective: Vitals:   02/16/19 1230 02/16/19 1245 02/16/19 1302 02/16/19 1409  BP: (!) 147/81 (!) 155/94 (!) 159/90 138/80  Pulse: 73 72 77 85  Resp: 18 20 16 18   Temp:  98.2 F (36.8 C) (!) 97.2 F (36.2 C) 98.6 F (37 C)  TempSrc:   Oral Oral  SpO2: 98% 97% 100% 93%  Weight:      Height:        Intake/Output Summary (Last 24 hours) at 02/16/2019 1432 Last data filed at 02/16/2019 1400 Gross per 24 hour  Intake 1278.46 ml  Output 2000 ml  Net -721.54 ml   Filed Weights   02/15/19 1200 02/16/19 1004  Weight: (!) 136.9 kg (!) 136.5 kg    Examination:  General exam: no distress noted.  Respiratory system: Clear to auscultation. Respiratory effort normal. Cardiovascular system: S1 & S2 heard, RRR. No JVD, murmurs, rubs, gallops  or clicks. No pedal edema. Gastrointestinal system: Abdomen is soft mildly tender in the RUQ.  Central nervous system: Alert and oriented. No focal neurological deficits. Extremities: Symmetric 5 x 5 power. Skin: No rashes, lesions or ulcers Psychiatry: Mood & affect appropriate.     Data Reviewed: I have personally reviewed following labs and imaging studies  CBC: Recent Labs  Lab 02/14/19 1812 02/15/19 0405  WBC 10.5 9.0  HGB 13.7 12.7*  HCT 43.4 39.3  MCV 87.7 87.1  PLT 343 304   Basic Metabolic Panel: Recent Labs  Lab 02/14/19 1812 02/14/19 2221 02/15/19 0405  NA 136  --  138  K 3.5  --  3.6  CL 100  --  103  CO2 26  --  26  GLUCOSE 89  --  91  BUN 9  --  9  CREATININE 0.98  --  1.05  CALCIUM 9.0  --  8.7*  MG  --  2.3 2.0  PHOS  --   --  4.0   GFR: Estimated Creatinine Clearance: 138.6 mL/min (by C-G formula based on SCr of 1.05 mg/dL). Liver Function Tests: Recent Labs  Lab 02/14/19 1812 02/15/19 0405  AST 19 16  ALT 29 26  ALKPHOS 74 64  BILITOT 0.9 0.9  PROT 9.4* 7.9  ALBUMIN 4.3 3.4*   Recent Labs  Lab 02/14/19 1812  LIPASE 30   No results for input(s): AMMONIA in the last 168 hours. Coagulation Profile: No results for input(s): INR, PROTIME in the last 168 hours. Cardiac Enzymes: No results for input(s): CKTOTAL, CKMB, CKMBINDEX, TROPONINI in the last 168 hours. BNP (last 3 results) No results for input(s): PROBNP in the last 8760 hours. HbA1C: No results for input(s): HGBA1C in the last 72 hours. CBG: No results for input(s): GLUCAP in the last 168 hours. Lipid Profile: No results for input(s): CHOL, HDL, LDLCALC, TRIG, CHOLHDL, LDLDIRECT in the last 72 hours. Thyroid Function Tests: Recent Labs    02/15/19 0405  TSH 1.643   Anemia Panel: No results for input(s): VITAMINB12, FOLATE, FERRITIN, TIBC, IRON, RETICCTPCT in the last 72 hours. Sepsis Labs: No results for input(s): PROCALCITON, LATICACIDVEN in the last 168 hours.   Recent Results (from the past 240 hour(s))  MRSA PCR Screening     Status: None   Collection Time: 02/15/19  2:40 AM  Result Value Ref Range Status   MRSA by PCR NEGATIVE NEGATIVE Final    Comment:        The GeneXpert MRSA Assay (FDA approved for NASAL specimens only), is one component of a comprehensive MRSA colonization surveillance program. It is not intended to diagnose MRSA infection nor to guide or monitor treatment for MRSA infections. Performed at [facility], [address] [place], [place] [address]          Radiology Studies: Dg Chest 2 View  Result Date: 02/14/2019 CLINICAL DATA:  Shortness of breath and cough EXAM: CHEST - 2 VIEW COMPARISON:  None. FINDINGS: The lungs are clear. Heart size and pulmonary vascularity are within normal limits. No adenopathy. No bone lesions. IMPRESSION: No edema or consolidation. Electronically Signed   By: Bretta Bang III M.D.   On: 02/14/2019 18:46   US Abdomen Complete  Result Date: 02/14/2019 CLINICAL DATA:  Upper abdominal pain EXAM: ABDOMEN ULTRASOUND COMPLETE COMPARISON:  Ultrasound 08/11/2018 FINDINGS: Gallbladder: Multiple shadowing stones measuring up to 1.4 mm. Increased wall thickness at 7.5 mm. Negative sonographic Murphy. Common bile duct: Diameter: 3.9 mm Liver: No focal lesion identified. Within normal limits in parenchymal echogenicity. Portal vein is patent on color Doppler imaging with normal direction of blood flow towards the liver. IVC: No abnormality visualized. Pancreas: Visualized portion unremarkable. Spleen: Size and appearance within normal limits. Right Kidney: Length: 12.9 cm. Echogenicity within normal limits. No mass or hydronephrosis visualized. Left Kidney: Length: 12.4 cm. Echogenicity within normal limits. No mass or hydronephrosis visualized. Abdominal aorta: No aneurysm visualized. Maximum AP diameter of 2.8 cm Other findings: None. IMPRESSION: 1. Cholelithiasis with  gallbladder wall thickening but negative for sonographic Murphy or pericholecystic fluid. Findings are nonspecific and could be secondary to acute or chronic cholecystitis, hepatic disease, or edematous forming states. Nuclear medicine hepatobiliary imaging could be helpful to assess for acute gallbladder disease. 2. Maximum AP diameter of the aorta measuring 2.8 cm Ectatic abdominal aorta at risk for aneurysm development. Recommend followup by ultrasound in 5 years. This recommendation follows ACR consensus guidelines: White Paper of the ACR Incidental Findings Committee II on Vascular Findings. J Am Coll Radiol 2013; 10:789-794. Electronically Signed   By: Jasmine Pang M.D.   On: 02/14/2019 19:38   Nm Hepato W/eject Fract  Result Date: 02/15/2019 CLINICAL DATA:  Cholecystitis/cholangitis. Evaluate for planned surgery. EXAM: NUCLEAR MEDICINE HEPATOBILIARY IMAGING TECHNIQUE: Sequential images of the abdomen were obtained out to 60 minutes following intravenous administration of radiopharmaceutical. RADIOPHARMACEUTICALS:  5.22 mCi Tc-2422m  Choletec IV COMPARISON:  Ultrasound February 14, 2019 FINDINGS: There is prompt uptake and washout from the liver. There is normal excretion into the bowel. No filling of the gallbladder despite the Marisue IvanLiz a shin of morphine. IMPRESSION: 1. Lack of gallbladder filling may represent acute cholecystitis in the setting of acute symptoms. Other potential causes include chronic cholecystitis, a recent meal, prolonged fasting, or severe illness. Electronically Signed   By: Gerome Samavid  Williams III M.D   On: 02/15/2019 16:39        Scheduled Meds: . amLODipine  10 mg Oral Daily  . bictegravir-emtricitabine-tenofovir AF  1 tablet Oral Daily   Continuous Infusions: . sodium chloride 100 mL/hr at 02/16/19 0120  . dextrose 5 % and 0.45 % NaCl with KCl 20 mEq/L 50 mL/hr at 02/16/19 1322  . piperacillin-tazobactam (ZOSYN)  IV 3.375 g (02/16/19 1323)     LOS: 2 days    Time spent:  35 minutes.    Kathlen ModyVijaya Shaylinn Hladik, MD [facility] Pager [phone]  If 7PM-7AM, please contact night-coverage [email or web address] Password [email or web address] 02/16/2019, 2:32 PM

## 2019-02-16 NOTE — Discharge Instructions (Signed)
[facility] ______[facility] °LAPAROSCOPIC SURGERY: POST OP INSTRUCTIONS °Always review your discharge instruction sheet given to you by the facility where your surgery was performed. °IF YOU HAVE DISABILITY OR FAMILY LEAVE FORMS, YOU MUST BRING THEM TO THE OFFICE FOR PROCESSING.   °DO NOT GIVE THEM TO YOUR DOCTOR. ° °1. A prescription for pain medication may be given to you upon discharge.  Take your pain medication as prescribed, if needed.  If narcotic pain medicine is not needed, then you may take acetaminophen (Tylenol) or ibuprofen (Advil) as needed. °2. Take your usually prescribed medications unless otherwise directed. °3. If you need a refill on your pain medication, please contact your pharmacy.  They will contact our office to request authorization. Prescriptions will not be filled after 5pm or on week-ends. °4. You should follow a light diet the first few days after arrival home, such as soup and crackers, etc.  Be sure to include lots of fluids daily. °5. Most patients will experience some swelling and bruising in the area of the incisions.  Ice packs will help.  Swelling and bruising can take several days to resolve.  °6. It is common to experience some constipation if taking pain medication after surgery.  Increasing fluid intake and taking a stool softener (such as Colace) will usually help or prevent this problem from occurring.  A mild laxative (Milk of Magnesia or Miralax) should be taken according to package instructions if there are no bowel movements after 48 hours. °7. Unless discharge instructions indicate otherwise, you may remove your bandages 24-48 hours after surgery, and you may shower at that time.  You may have steri-strips (small skin tapes) in place directly over the incision.  These strips should be left on the skin for 7-10 days.  If your surgeon used skin glue on the incision, you may shower in 24 hours.  The glue will flake off over the next 2-3 weeks.  Any sutures or  staples will be removed at the office during your follow-up visit. °8. ACTIVITIES:  You may resume regular (light) daily activities beginning the next day--such as daily self-care, walking, climbing stairs--gradually increasing activities as tolerated.  You may have sexual intercourse when it is comfortable.  Refrain from any heavy lifting or straining until approved by your doctor. °a. You may drive when you are no longer taking prescription pain medication, you can comfortably wear a seatbelt, and you can safely maneuver your car and apply brakes. °b. RETURN TO WORK:  __________________________________________________________ °9. You should see your doctor in the office for a follow-up appointment approximately 2-3 weeks after your surgery.  Make sure that you call for this appointment within a day or two after you arrive home to insure a convenient appointment time. °10. OTHER INSTRUCTIONS: __________________________________________________________________________________________________________________________ __________________________________________________________________________________________________________________________ °WHEN TO CALL YOUR DOCTOR: °1. Fever over 101.0 °2. Inability to urinate °3. Continued bleeding from incision. °4. Increased pain, redness, or drainage from the incision. °5. Increasing abdominal pain ° °The clinic staff is available to answer your questions during regular business hours.  Please don’t hesitate to call and ask to speak to one of the nurses for clinical concerns.  If you have a medical emergency, go to the nearest emergency room or call 911.  A surgeon from [facility] is always on call at the hospital. °[address], [address], [place], [place]  [address] ? [address] [address], [place], [place]   [address] °[phone] ? [phone] ? FAX [phone] °Web site:   [email or web address] °

## 2019-02-16 NOTE — Anesthesia Procedure Notes (Signed)
Procedure Name: Intubation Performed by: Gean Maidens, CRNA Pre-anesthesia Checklist: Patient identified, Emergency Drugs available, Suction available, Patient being monitored and Timeout performed Patient Re-evaluated:Patient Re-evaluated prior to induction Oxygen Delivery Method: Circle system utilized Preoxygenation: Pre-oxygenation with 100% oxygen Induction Type: IV induction Ventilation: Mask ventilation without difficulty Laryngoscope Size: Glidescope and 4 (CL x1 Mac 4 blade unable to distinguish epiglotis from soft tissue) Grade View: Grade II Tube type: Oral Tube size: 7.5 mm Number of attempts: 2 Airway Equipment and Method: Stylet and Video-laryngoscopy Placement Confirmation: ETT inserted through vocal cords under direct vision,  positive ETCO2 and breath sounds checked- equal and bilateral Secured at: 23 cm Tube secured with: Tape Dental Injury: Teeth and Oropharynx as per pre-operative assessment

## 2019-02-16 NOTE — Op Note (Signed)
Procedure Note  Pre-operative Diagnosis:  Acute cholecystitis, cholelithiasis  Post-operative Diagnosis:  same  Surgeon:  Darnell Level, MD  Assistant:  Hedda Slade, PA-C   Procedure:  Laparoscopic cholecystectomy  Anesthesia:  General  Estimated Blood Loss:  minimal  Drains: none         Specimen: gallbladder to pathology  Indications:  Patient is a 33 year old male who presents to the emergency department with 5-day history of persistent right upper quadrant and epigastric abdominal pain.  Patient has had intermittent episodes of pain for approximately 6 months.  Previous evaluation included an ultrasound demonstrating gallstones.  Patient has had intervals where he is pain-free.  He does not note any precipitating factors.  He has had no prior abdominal surgery.  He has no prior history of hepatobiliary disease.  He denies jaundice.  He is not had any gastrointestinal work-up and is never had an upper endoscopy.  In the emergency department white blood cell count was at the upper limit of normal.  Liver function test are normal.  Patient is afebrile.  Ultrasound of the abdomen shows a markedly thickened gallbladder wall at 7.5 mm with gallstones.  There is no biliary dilatation.  There is no pericholecystic fluid.  HIDA scan was positive with non-visualization of the gallbladder.  Patient now comes to surgery for cholecystectomy.  Procedure Details:  The patient was seen in the pre-op holding area. The risks, benefits, complications, treatment options, and expected outcomes were previously discussed with the patient. The patient agreed with the proposed plan and has signed the informed consent form.  The patient was transported to operating room # 4 at the [facility]. The patient was placed in the supine position on the operating room table. Following induction of general anesthesia, the abdomen was prepped and draped in the usual aseptic fashion.  An incision was made in the skin  near the umbilicus. The midline fascia was incised and the peritoneal cavity was entered and a Hasson cannula was introduced under direct vision. The cannula was secured with a 0-Vicryl pursestring suture. Pneumoperitoneum was established with carbon dioxide. Additional cannulae were introduced under direct vision along the right costal margin in the midline, mid-clavicular line, and anterior axillary line.   The gallbladder was identified and the fundus grasped and retracted cephalad. The gallbladder was markedly thickened and firm. Adhesions were taken down bluntly and the electrocautery was utilized as needed, taking care not to involve any adjacent structures. The infundibulum was grasped and retracted laterally, exposing the peritoneum overlying the triangle of Calot. The peritoneum was incised and structures exposed with blunt dissection. The cystic duct was clearly identified and bluntly dissected circumferentially. The cystic duct was then ligated with ligaclips and divided. The cystic artery was identified, dissected circumferentially, ligated with ligaclips, and divided.  The gallbladder was dissected away from the gallbladder bed using the electrocautery for hemostasis. The gallbladder was completely removed from the liver and placed into an endocatch bag. The gallbladder was removed in the endocatch bag through the umbilical port site and submitted to pathology for review.  The right upper quadrant was irrigated and the gallbladder bed was inspected. Hemostasis was achieved with the electrocautery.  Cannulae were removed under direct vision and good hemostasis was noted. Pneumoperitoneum was released and the majority of the carbon dioxide evacuated. The umbilical wound was irrigated and the fascia was then closed with the pursestring suture.  Local anesthetic was infiltrated at all port sites. Skin incisions were closed with stainless steel  staples.  Instrument, sponge, and needle counts were  correct at the conclusion of the case.  The patient was awakened from anesthesia and brought to the recovery room in stable condition.  The patient tolerated the procedure well.   Darnell Level, MD [facility] Office: [phone]

## 2019-02-16 NOTE — Transfer of Care (Signed)
Immediate Anesthesia Transfer of Care Note  Patient: Peter Herring  Procedure(s) Performed: LAPAROSCOPIC CHOLECYSTECTOMY (N/A Abdomen)  Patient Location: PACU  Anesthesia Type:General  Level of Consciousness: awake, alert  and oriented  Airway & Oxygen Therapy: Patient Spontanous Breathing and Patient connected to face mask oxygen  Post-op Assessment: Report given to RN and Post -op Vital signs reviewed and stable  Post vital signs: Reviewed and stable  Last Vitals:  Vitals Value Taken Time  BP    Temp    Pulse    Resp    SpO2      Last Pain:  Vitals:   02/16/19 1004  TempSrc:   PainSc: 0-No pain      Patients Stated Pain Goal: 4 (02/16/19 1004)  Complications: No apparent anesthesia complications

## 2019-02-17 ENCOUNTER — Encounter (HOSPITAL_COMMUNITY): Payer: Self-pay | Admitting: Surgery

## 2019-02-17 DIAGNOSIS — R112 Nausea with vomiting, unspecified: Secondary | ICD-10-CM

## 2019-02-17 MED ORDER — OXYCODONE HCL 5 MG PO TABS: ORAL_TABLET | ORAL | 0 refills | Status: DC

## 2019-02-17 MED ORDER — ACETAMINOPHEN 500 MG PO TABS
1000.0000 mg | ORAL_TABLET | Freq: Three times a day (TID) | ORAL | Status: DC
  Administered 2019-02-17: 1000 mg via ORAL
  Filled 2019-02-17: qty 2

## 2019-02-17 MED ORDER — IBUPROFEN 200 MG PO TABS: 600.0000 mg | ORAL_TABLET | Freq: Four times a day (QID) | ORAL | Status: DC | PRN

## 2019-02-17 MED ORDER — ACETAMINOPHEN 500 MG PO TABS: ORAL_TABLET | ORAL | 0 refills | Status: DC

## 2019-02-17 MED ORDER — IBUPROFEN 200 MG PO TABS: ORAL_TABLET | ORAL | Status: DC

## 2019-02-17 NOTE — Progress Notes (Signed)
1 Day Post-Op    CC: Abdominal pain  Subjective: Patient looks good this a.m.  We recommend discharge when he can eat drink walk and void without difficulty.  We were not able to do a cholangiogram and will get LFTs when he goes back to the office to get his staples out.  Objective: Vital signs in last 24 hours: Temp:  [97.2 F (36.2 C)-98.6 F (37 C)] 98.2 F (36.8 C) (03/17 0541) Pulse Rate:  [72-91] 81 (03/17 0541) Resp:  [16-20] 18 (03/17 0541) BP: (127-163)/(67-98) 131/73 (03/17 0541) SpO2:  [93 %-100 %] 99 % (03/17 0541) Weight:  [136.5 kg] 136.5 kg (03/16 1004) Last BM Date: 02/13/19 For 20 p.o.  1600 IV  1875 urine   Afebrile vital signs are stable Labs are all essentially normal.  intake/Output from previous day: 03/16 0701 - 03/17 0700 In: 1866.3 [P.O.:420; I.V.:1290.2; IV Piggyback:156.1] Out: 1900 [Urine:1875; Blood:25] Intake/Output this shift: No intake/output data recorded.  General appearance: alert, cooperative and no distress Resp: clear to auscultation bilaterally GI: Soft, sore, port sites all look good.  Lab Results:  Recent Labs    02/14/19 1812 02/15/19 0405  WBC 10.5 9.0  HGB 13.7 12.7*  HCT 43.4 39.3  PLT 343 304    BMET Recent Labs    02/14/19 1812 02/15/19 0405  NA 136 138  K 3.5 3.6  CL 100 103  CO2 26 26  GLUCOSE 89 91  BUN 9 9  CREATININE 0.98 1.05  CALCIUM 9.0 8.7*   PT/INR No results for input(s): LABPROT, INR in the last 72 hours.  Recent Labs  Lab 02/14/19 1812 02/15/19 0405  AST 19 16  ALT 29 26  ALKPHOS 74 64  BILITOT 0.9 0.9  PROT 9.4* 7.9  ALBUMIN 4.3 3.4*     Lipase     Component Value Date/Time   LIPASE 30 02/14/2019 1812     Medications: . amLODipine  10 mg Oral Daily  . bictegravir-emtricitabine-tenofovir AF  1 tablet Oral Daily   . sodium chloride 100 mL/hr at 02/16/19 0120  . dextrose 5 % and 0.45 % NaCl with KCl 20 mEq/L 50 mL/hr at 02/16/19 1322  . piperacillin-tazobactam (ZOSYN)  IV  3.375 g (02/17/19 0547)    Assessment/Plan HIV Hypertension Hyperkalemia BMI 41.9/morbid obesity  Acute cholecystitis/cholelithiasis Laparoscopic cholecystectomy, 02/16/2019, Dr. Darnell Level  FEN: IV fluids/regular diet OJ:[id number] AF (BIKTARVY) 50-200-25 MG per tablet 1 tablet daily   -Zosyn 3/14 >> day 4 DVT: SCDs only Follow-up [facility]   Plan: He can have Tylenol, ibuprofen, and oxycodone for pain.  From our standpoint you can discontinue the antibiotics.  I will arrange for follow-up at our clinic and a repeat LFTs.    LOS: 3 days    Jasmine Mcbeth 02/17/2019 [phone]

## 2019-02-17 NOTE — Progress Notes (Signed)
Discharge instructions given to pt and all questions were answered. Pt taken down to lobby and picked up by his mother.

## 2019-02-19 LAB — HIV-1 RNA QUANT-NO REFLEX-BLD
HIV 1 RNA Quant: <20 copies/mL
LOG10 HIV-1 RNA: UNDETERMINED log10copy/mL

## 2019-02-20 NOTE — Discharge Summary (Signed)
Physician Discharge Summary  Peter Herring MHD:[id number] DOB: 1986/10/19 DOA: 02/14/2019  PCP: Patient, No Pcp Per  Admit date: 02/14/2019 Discharge date: 02/17/2019  Admitted From: Home.  Disposition:  Home.   Recommendations for Outpatient Follow-up:  1. Follow up with PCP in 1-2 weeks 2. Please obtain BMP/CBC in one week Please follow up with surgery are commended.   Discharge Condition:stable.  CODE STATUS: Full code.  Diet recommendation: Heart Healthy  Brief/Interim Summary: Peter Herring a 33 y.o.malewith medical history significant of HIV (last CD4 1080 on 12/25/18), HTN, and morbid obesity. Presented with6 daysUpper abd pain associated with nausea and vomiting. He have had gall stones in the past and this feels similar. US showed cholecystitis  Discharge Diagnoses:  Principal Problem:   Abdominal pain, right upper quadrant Active Problems:   HIV disease (HCC)   Essential hypertension   Cholelithiasis with chronic cholecystitis   Hypokalemia   Abdominal pain, with nausea and vomiting: - sec to acute cholecystitis.  - surgery consulted , underwent LAP cholecystectomy.  Pain control and anti emetics as needed.     Hypertension: Well controlled.    HIV disease: resume home meds. BIKTARVY   Discharge Instructions  Discharge Instructions    Diet - low sodium heart healthy   Complete by:  As directed    Discharge instructions   Complete by:  As directed    Follow up with surgery as recommended.   Increase activity slowly   Complete by:  As directed      Allergies as of 02/17/2019   No Known Allergies     Medication List    STOP taking these medications   guaiFENesin 600 MG 12 hr tablet Commonly known as:  MUCINEX   oxycodone 5 MG capsule Commonly known as:  OXY-IR Replaced by:  oxyCODONE 5 MG immediate release tablet     TAKE these medications   acetaminophen 500 MG tablet Commonly known as:  TYLENOL Plan to take 1000 mg every 8  hours for the next couple days until your pain is improved.  This is your first pain medication.  Can buy this over-the-counter at any drugstore.  Do not take more than 4000 mg of Tylenol per day it can harm your liver.   amLODipine 10 MG tablet Commonly known as:  NORVASC Take 1 tablet (10 mg total) by mouth daily.   bictegravir-emtricitabine-tenofovir AF 50-200-25 MG Tabs tablet Commonly known as:  BIKTARVY Take 1 tablet by mouth daily.   ibuprofen 200 MG tablet Commonly known as:  ADVIL,MOTRIN You can take 2 to 3 tablets every 6 hours as needed for pain not relieved by plain Tylenol.  Is your second pain medication.  Do not exceed this limit.  You can buy this over-the-counter at any drugstore.   lisinopril-hydrochlorothiazide 20-12.5 MG tablet Commonly known as:  PRINZIDE,ZESTORETIC TAKE 1 TABLET BY MOUTH DAILY CALL [phone] TO SCHEDULE AN APPOINTMENT   ondansetron 8 MG tablet Commonly known as:  ZOFRAN Take 8 mg by mouth every 8 (eight) hours as needed for nausea or vomiting.   oxyCODONE 5 MG immediate release tablet Commonly known as:  Oxy IR/ROXICODONE He can take 1 tablet every 6 hours as needed for pain not relieved by plain Tylenol and ibuprofen.  Is your third pain medication.  Will not refill this prescription. Replaces:  oxycodone 5 MG capsule      Follow-up Information    [facility], [facility] Follow up on 02/26/2019.   Specialty:  General Surgery Why:  Your appointment is at 2 PM for staple removal.  You have a second appointment 03/04/19 @ 3:45PM.  Be at the office 30 minutes early for check in.  Bring photo ID and insurance information.  On 02/26/19 go to lab for blood test. Contact information: [address] [place] [address] [phone]          No Known Allergies  Consultations: Surgery for acute cholecystitis.   Procedures/Studies: Dg Chest 2 View  Result Date: 02/14/2019 CLINICAL DATA:  Shortness of breath and cough EXAM:  CHEST - 2 VIEW COMPARISON:  None. FINDINGS: The lungs are clear. Heart size and pulmonary vascularity are within normal limits. No adenopathy. No bone lesions. IMPRESSION: No edema or consolidation. Electronically Signed   By: Bretta Bang III M.D.   On: 02/14/2019 18:46   US Abdomen Complete  Result Date: 02/14/2019 CLINICAL DATA:  Upper abdominal pain EXAM: ABDOMEN ULTRASOUND COMPLETE COMPARISON:  Ultrasound 08/11/2018 FINDINGS: Gallbladder: Multiple shadowing stones measuring up to 1.4 mm. Increased wall thickness at 7.5 mm. Negative sonographic Murphy. Common bile duct: Diameter: 3.9 mm Liver: No focal lesion identified. Within normal limits in parenchymal echogenicity. Portal vein is patent on color Doppler imaging with normal direction of blood flow towards the liver. IVC: No abnormality visualized. Pancreas: Visualized portion unremarkable. Spleen: Size and appearance within normal limits. Right Kidney: Length: 12.9 cm. Echogenicity within normal limits. No mass or hydronephrosis visualized. Left Kidney: Length: 12.4 cm. Echogenicity within normal limits. No mass or hydronephrosis visualized. Abdominal aorta: No aneurysm visualized. Maximum AP diameter of 2.8 cm Other findings: None. IMPRESSION: 1. Cholelithiasis with gallbladder wall thickening but negative for sonographic Murphy or pericholecystic fluid. Findings are nonspecific and could be secondary to acute or chronic cholecystitis, hepatic disease, or edematous forming states. Nuclear medicine hepatobiliary imaging could be helpful to assess for acute gallbladder disease. 2. Maximum AP diameter of the aorta measuring 2.8 cm Ectatic abdominal aorta at risk for aneurysm development. Recommend followup by ultrasound in 5 years. This recommendation follows ACR consensus guidelines: White Paper of the ACR Incidental Findings Committee II on Vascular Findings. J Am Coll Radiol 2013; 10:789-794. Electronically Signed   By: Jasmine Pang M.D.   On:  02/14/2019 19:38   Nm Hepato W/eject Fract  Result Date: 02/15/2019 CLINICAL DATA:  Cholecystitis/cholangitis. Evaluate for planned surgery. EXAM: NUCLEAR MEDICINE HEPATOBILIARY IMAGING TECHNIQUE: Sequential images of the abdomen were obtained out to 60 minutes following intravenous administration of radiopharmaceutical. RADIOPHARMACEUTICALS:  5.22 mCi Tc-57m  Choletec IV COMPARISON:  Ultrasound February 14, 2019 FINDINGS: There is prompt uptake and washout from the liver. There is normal excretion into the bowel. No filling of the gallbladder despite the Marisue Ivan a shin of morphine. IMPRESSION: 1. Lack of gallbladder filling may represent acute cholecystitis in the setting of acute symptoms. Other potential causes include chronic cholecystitis, a recent meal, prolonged fasting, or severe illness. Electronically Signed   By: Gerome Sam III M.D   On: 02/15/2019 16:39       Subjective: No chest pain or sob. No nause,a vomiting or abdominal pain.   Discharge Exam: Vitals:   02/17/19 1007 02/17/19 1332  BP: (!) 143/84 138/81  Pulse: 77 82  Resp: 18 16  Temp: 98.1 F (36.7 C) 98.4 F (36.9 C)  SpO2: 100% 100%   Vitals:   02/17/19 0157 02/17/19 0541 02/17/19 1007 02/17/19 1332  BP: 127/79 131/73 (!) 143/84 138/81  Pulse: 79 81 77 82  Resp: 18 18 18  16  Temp: 97.9 F (36.6 C) 98.2 F (36.8 C) 98.1 F (36.7 C) 98.4 F (36.9 C)  TempSrc: Oral Oral Oral Oral  SpO2: 98% 99% 100% 100%  Weight:      Height:        General: Pt is alert, awake, not in acute distress Cardiovascular: RRR, S1/S2 +, no rubs, no gallops Respiratory: CTA bilaterally, no wheezing, no rhonchi Abdominal: Soft, NT, ND, bowel sounds + Extremities: no edema, no cyanosis    The results of significant diagnostics from this hospitalization (including imaging, microbiology, ancillary and laboratory) are listed below for reference.     Microbiology: Recent Results (from the past 240 hour(s))  MRSA PCR Screening      Status: None   Collection Time: 02/15/19  2:40 AM  Result Value Ref Range Status   MRSA by PCR NEGATIVE NEGATIVE Final    Comment:        The GeneXpert MRSA Assay (FDA approved for NASAL specimens only), is one component of a comprehensive MRSA colonization surveillance program. It is not intended to diagnose MRSA infection nor to guide or monitor treatment for MRSA infections. Performed at [facility], [address] [place], [place] [address]      Labs: BNP (last 3 results) No results for input(s): BNP in the last 8760 hours. Basic Metabolic Panel: Recent Labs  Lab 02/14/19 1812 02/14/19 2221 02/15/19 0405  NA 136  --  138  K 3.5  --  3.6  CL 100  --  103  CO2 26  --  26  GLUCOSE 89  --  91  BUN 9  --  9  CREATININE 0.98  --  1.05  CALCIUM 9.0  --  8.7*  MG  --  2.3 2.0  PHOS  --   --  4.0   Liver Function Tests: Recent Labs  Lab 02/14/19 1812 02/15/19 0405  AST 19 16  ALT 29 26  ALKPHOS 74 64  BILITOT 0.9 0.9  PROT 9.4* 7.9  ALBUMIN 4.3 3.4*   Recent Labs  Lab 02/14/19 1812  LIPASE 30   No results for input(s): AMMONIA in the last 168 hours. CBC: Recent Labs  Lab 02/14/19 1812 02/15/19 0405  WBC 10.5 9.0  HGB 13.7 12.7*  HCT 43.4 39.3  MCV 87.7 87.1  PLT 343 304   Cardiac Enzymes: No results for input(s): CKTOTAL, CKMB, CKMBINDEX, TROPONINI in the last 168 hours. BNP: Invalid input(s): POCBNP CBG: No results for input(s): GLUCAP in the last 168 hours. D-Dimer No results for input(s): DDIMER in the last 72 hours. Hgb A1c No results for input(s): HGBA1C in the last 72 hours. Lipid Profile No results for input(s): CHOL, HDL, LDLCALC, TRIG, CHOLHDL, LDLDIRECT in the last 72 hours. Thyroid function studies No results for input(s): TSH, T4TOTAL, T3FREE, THYROIDAB in the last 72 hours.  Invalid input(s): FREET3 Anemia work up No results for input(s): VITAMINB12, FOLATE, FERRITIN, TIBC, IRON, RETICCTPCT in the last 72  hours. Urinalysis    Component Value Date/Time   COLORURINE YELLOW 02/14/2019 1745   APPEARANCEUR HAZY (A) 02/14/2019 1745   LABSPEC 1.023 02/14/2019 1745   PHURINE 5.0 02/14/2019 1745   GLUCOSEU NEGATIVE 02/14/2019 1745   HGBUR SMALL (A) 02/14/2019 1745   BILIRUBINUR NEGATIVE 02/14/2019 1745   BILIRUBINUR negative 06/11/2016 1811   KETONESUR 20 (A) 02/14/2019 1745   PROTEINUR 30 (A) 02/14/2019 1745   UROBILINOGEN 1.0 06/11/2016 1811   NITRITE NEGATIVE 02/14/2019 1745  LEUKOCYTESUR NEGATIVE 02/14/2019 1745   Sepsis Labs Invalid input(s): PROCALCITONIN,  WBC,  LACTICIDVEN Microbiology Recent Results (from the past 240 hour(s))  MRSA PCR Screening     Status: None   Collection Time: 02/15/19  2:40 AM  Result Value Ref Range Status   MRSA by PCR NEGATIVE NEGATIVE Final    Comment:        The GeneXpert MRSA Assay (FDA approved for NASAL specimens only), is one component of a comprehensive MRSA colonization surveillance program. It is not intended to diagnose MRSA infection nor to guide or monitor treatment for MRSA infections. Performed at [facility], [address] [place], [place] [id number]      Time coordinating discharge: 35  minutes  SIGNED:   Kathlen ModyVijaya Linette Gunderson, MD  [facility] 02/20/2019, 9:34 AM Pager   If 7PM-7AM, please contact night-coverage [email or web address] Password [email or web address]

## 2019-02-28 MED FILL — AMLODIPINE BESYLATE 10 MG T: 10 | 30 days supply | Qty: 30 | Fill #2

## 2019-02-28 MED FILL — BIKTARVY 50-200-25 MG TABS: 50-200-25 | 30 days supply | Qty: 30 | Fill #2

## 2019-02-28 MED FILL — LISINOPRIL-HCTZ 20-12.5 MG: 20-12.5 | 30 days supply | Qty: 30 | Fill #1

## 2019-03-25 ENCOUNTER — Telehealth: Payer: Self-pay | Admitting: Family

## 2019-03-25 NOTE — Telephone Encounter (Signed)
COVID-19 Pre-Screening Questions: ° °Do you currently have a fever (>100 °F), chills or unexplained body aches? no  ° °Are you currently experiencing new cough, shortness of breath, sore throat, runny nose?no  °•  °Have you recently travelled outside the state of  in the last 14 days? no °•  °Have you been in contact with someone that is currently pending confirmation of Covid19 testing or has been confirmed to have the Covid19 virus? no °

## 2019-03-26 ENCOUNTER — Ambulatory Visit: Payer: BLUE CROSS/BLUE SHIELD | Admitting: Family

## 2019-03-26 ENCOUNTER — Other Ambulatory Visit: Payer: Self-pay

## 2019-03-26 ENCOUNTER — Encounter: Payer: Self-pay | Admitting: Family

## 2019-03-26 ENCOUNTER — Other Ambulatory Visit: Payer: Self-pay | Admitting: Family

## 2019-03-26 VITALS — BP 92/72 | HR 99 | Temp 98.6°F | Wt 299.0 lb

## 2019-03-26 DIAGNOSIS — B20 Human immunodeficiency virus [HIV] disease: Secondary | ICD-10-CM | POA: Diagnosis not present

## 2019-03-26 DIAGNOSIS — Z23 Encounter for immunization: Secondary | ICD-10-CM | POA: Diagnosis not present

## 2019-03-26 DIAGNOSIS — I10 Essential (primary) hypertension: Secondary | ICD-10-CM

## 2019-03-26 DIAGNOSIS — R0683 Snoring: Secondary | ICD-10-CM

## 2019-03-26 MED ORDER — BICTEGRAVIR-EMTRICITAB-TENOFOV 50-200-25 MG PO TABS: 1.0000 | ORAL_TABLET | Freq: Every day | ORAL | 5 refills | Status: DC

## 2019-03-26 NOTE — Assessment & Plan Note (Signed)
Peter Herring is lost 21 pounds since his last office visit and congratulated on his continued success.  Discussed importance of continuing nutritional changes and physical activity as tolerated.

## 2019-03-26 NOTE — Assessment & Plan Note (Signed)
Mr. Frieders has well-controlled HIV disease with his current ART regimen of Biktarvy with good tolerance and adherence.  He has no signs/symptoms of opportunistic infection or progressive HIV disease at present.  Check blood work today.  Continue current dose of Biktarvy.  Plan for follow-up in 3 months or sooner if needed with lab work on the same day.

## 2019-03-26 NOTE — Progress Notes (Addendum)
Subjective:    Patient ID: Peter Herring, male    DOB: August 08, 1986, 33 y.o.   MRN: [id number]  Chief Complaint  Patient presents with  . HIV Positive/AIDS     HPI:  Peter Herring is a 33 y.o. male with history of HIV disease and recent cholecystitis who presents today for routine follow-up of HIV disease and was last seen in the office on 12/25/2018 with good adherence and tolerance to his ART regimen of Biktarvy.  CD4 count was 1020 with a viral load that was undetectable.  Since he was last seen in the office he underwent a cholecystectomy and has lost approximately 21 pounds through nutritional changes.  He is due for second dose of Pneumovax today.  Peter Herring has been taking his Biktarvy as prescribed with no adverse side effects or missed doses.  Overall feeling well today.Denies fevers, chills, night sweats, headaches, changes in vision, neck pain/stiffness, nausea, diarrhea, vomiting, lesions or rashes.  Peter Herring remains covered commercially through [facility] and has no problems obtaining his medication from was a long outpatient pharmacy.  He is recovered from his surgery well.  Denies feelings of being down, depressed, or hopeless.  He is sexually active with the same partner of approximately 7 years.  No recreational or illicit drug use or alcohol consumption.  ADDENDUM: Peter Herring has also been experiencing fatigue and snoring that he has been told he stops breathing at times throughout the night. He is requesting evaluation for sleep apnea.   No Known Allergies   Past Medical History:  Diagnosis Date  . HIV infection (HCC)   . Hypertension      Past Surgical History:  Procedure Laterality Date  . CHOLECYSTECTOMY N/A 02/16/2019   Procedure: LAPAROSCOPIC CHOLECYSTECTOMY;  Surgeon: Darnell Level, MD;  Location: [facility] ORS;  Service: General;  Laterality: N/A;       Review of Systems  Constitutional: Negative for appetite change, chills, fatigue, fever and  unexpected weight change.  Eyes: Negative for visual disturbance.  Respiratory: Negative for cough, chest tightness, shortness of breath and wheezing.   Cardiovascular: Negative for chest pain and leg swelling.  Gastrointestinal: Negative for abdominal pain, constipation, diarrhea, nausea and vomiting.  Genitourinary: Negative for dysuria, flank pain, frequency, genital sores, hematuria and urgency.  Skin: Negative for rash.  Allergic/Immunologic: Negative for immunocompromised state.  Neurological: Negative for dizziness and headaches.      Objective:    BP 92/72   Pulse 99   Temp 98.6 F (37 C)   Wt 299 lb (135.6 kg)   BMI 41.70 kg/m  Nursing note and vital signs reviewed.  Physical Exam Constitutional:      General: He is not in acute distress.    Appearance: He is well-developed. He is obese.     Comments: Seated in the chair; pleasant.   Eyes:     Conjunctiva/sclera: Conjunctivae normal.  Neck:     Musculoskeletal: Neck supple.  Cardiovascular:     Rate and Rhythm: Normal rate and regular rhythm.     Heart sounds: Normal heart sounds. No murmur. No friction rub. No gallop.   Pulmonary:     Effort: Pulmonary effort is normal. No respiratory distress.     Breath sounds: Normal breath sounds. No wheezing or rales.  Chest:     Chest wall: No tenderness.  Abdominal:     General: Bowel sounds are normal.     Palpations: Abdomen is soft.     Tenderness: There  is no abdominal tenderness.  Lymphadenopathy:     Cervical: No cervical adenopathy.  Skin:    General: Skin is warm and dry.     Findings: No rash.  Neurological:     Mental Status: He is alert and oriented to person, place, and time.        Assessment & Plan:   Problem List Items Addressed This Visit      Cardiovascular and Mediastinum   Essential hypertension    Blood pressure well controlled today and not currently on medications.  Encouraged to monitor blood pressures at home.  No signs of endorgan  damage at present.  Continue to monitor.        Other   HIV disease (HCC) - Primary    Peter Herring has well-controlled HIV disease with his current ART regimen of Biktarvy with good tolerance and adherence.  He has no signs/symptoms of opportunistic infection or progressive HIV disease at present.  Check blood work today.  Continue current dose of Biktarvy.  Plan for follow-up in 3 months or sooner if needed with lab work on the same day.      Relevant Medications   bictegravir-emtricitabine-tenofovir AF (BIKTARVY) 50-200-25 MG TABS tablet   Other Relevant Orders   HIV-1 RNA quant-no reflex-bld   T-helper cell (CD4)- ([facility] clinic only)   COMPLETE METABOLIC PANEL WITH GFR   Pneumococcal polysaccharide vaccine 23-valent greater than or equal to 2yo subcutaneous/IM (Completed)   Obesity, morbid, BMI 40.0-49.9 (HCC)    Peter Herring is lost 21 pounds since his last office visit and congratulated on his continued success.  Discussed importance of continuing nutritional changes and physical activity as tolerated.      Snoring    Peter Herring has symptoms concerning for sleep apnea with greatest risk factor at present being morbid obesity with BMI >40. Will refer for sleep study and further evaluation.        Other Visit Diagnoses    Need for pneumococcal vaccine       Relevant Orders   Pneumococcal polysaccharide vaccine 23-valent greater than or equal to 2yo subcutaneous/IM (Completed)       I have discontinued Peter Herring's amLODipine and lisinopril-hydrochlorothiazide. I am also having him maintain his ondansetron, acetaminophen, ibuprofen, oxyCODONE, and bictegravir-emtricitabine-tenofovir AF.   Meds ordered this encounter  Medications  . bictegravir-emtricitabine-tenofovir AF (BIKTARVY) 50-200-25 MG TABS tablet    Sig: Take 1 tablet by mouth daily.    Dispense:  30 tablet    Refill:  5    Order Specific Question:   Supervising Provider    Answer:   Judyann MunsonSNIDER, CYNTHIA [[id number]]      Follow-up: Return in about 3 months (around 06/25/2019), or if symptoms worsen or fail to improve.   Marcos EkeGreg Jerre Diguglielmo, MSN, FNP-C Nurse Practitioner [facility] Main number: [phone]

## 2019-03-26 NOTE — Assessment & Plan Note (Signed)
Peter Herring has symptoms concerning for sleep apnea with greatest risk factor at present being morbid obesity with BMI >40. Will refer for sleep study and further evaluation.

## 2019-03-26 NOTE — Patient Instructions (Signed)
Nice to see you.  Great job with the weight loss.  Continue to take your Arthur as prescribed.  We will check your blood work today.  Plan for office follow up in 3 months or sooner if needed with lab work same day.

## 2019-03-26 NOTE — Assessment & Plan Note (Signed)
Blood pressure well controlled today and not currently on medications.  Encouraged to monitor blood pressures at home.  No signs of endorgan damage at present.  Continue to monitor.

## 2019-03-27 LAB — T-HELPER CELL (CD4) - (RCID CLINIC ONLY)
CD4 % Helper T Cell: 43 % (ref 33–55)
CD4 T Cell Abs: 1290 /uL (ref 400–2700)

## 2019-03-30 ENCOUNTER — Telehealth: Payer: Self-pay | Admitting: Neurology

## 2019-03-30 NOTE — Telephone Encounter (Signed)
Due to current COVID 19 pandemic, our office is severely reducing in office visits, in order to minimize the risk to our patients and healthcare providers. Pt understands that although there may be some limitations with this type of visit, we will take all precautions to reduce any security or privacy concerns.  Pt understands that this will be treated like an in office visit and we will file with pt's insurance, and there may be a patient responsible charge related to this service. Pt's email is [email or web address]. Pt understands that the cisco webex software must be downloaded and operational on the device pt plans to use for the visit. Pt understands that the nurse will be calling to go over pt's chart.

## 2019-03-31 LAB — COMPLETE METABOLIC PANEL WITH GFR
AG Ratio: 1 (calc) (ref 1.0–2.5)
ALT: 61 U/L — ABNORMAL HIGH (ref 9–46)
AST: 17 U/L (ref 10–40)
Albumin: 4 g/dL (ref 3.6–5.1)
Alkaline phosphatase (APISO): 89 U/L (ref 36–130)
BUN: 12 mg/dL (ref 7–25)
CO2: 26 mmol/L (ref 20–32)
Calcium: 9.7 mg/dL (ref 8.6–10.3)
Chloride: 107 mmol/L (ref 98–110)
Creat: 0.99 mg/dL (ref 0.60–1.35)
GFR, Est African American: 114 mL/min/{1.73_m2} (ref >=60)
GFR, Est Non African American: 98 mL/min/{1.73_m2} (ref >=60)
Globulin: 4.1 g/dL (calc) — ABNORMAL HIGH (ref 1.9–3.7)
Glucose, Bld: 111 mg/dL — ABNORMAL HIGH (ref 65–99)
Potassium: 4.6 mmol/L (ref 3.5–5.3)
Sodium: 141 mmol/L (ref 135–146)
Total Bilirubin: 0.3 mg/dL (ref 0.2–1.2)
Total Protein: 8.1 g/dL (ref 6.1–8.1)

## 2019-03-31 LAB — HIV-1 RNA QUANT-NO REFLEX-BLD
HIV 1 RNA Quant: <20 copies/mL
HIV-1 RNA Quant, Log: <1.3 Log copies/mL

## 2019-04-02 NOTE — Addendum Note (Signed)
Addended by: Geronimo Running A on: 04/02/2019 02:51 PM   Modules accepted: Orders

## 2019-04-02 NOTE — Telephone Encounter (Signed)
I called pt. Pt's meds, allergies, and PMH were updated.  Pt reports that he has never had a sleep study. Pt does endorse snoring.  Pt's weight is 299 lbs and he is 5'11.  Pt was instructed on how to measure his neck size prior to his appt.  Epworth Sleepiness Scale 0= would never doze 1= slight chance of dozing 2= moderate chance of dozing 3= high chance of dozing  Sitting and reading: 3 Watching TV: 1 Sitting inactive in a public place (ex. Theater or meeting): 1 As a passenger in a car for an hour without a break: 0 Lying down to rest in the afternoon: 2 Sitting and talking to someone: 1 Sitting quietly after lunch (no alcohol): 1 In a car, while stopped in traffic: 1 Total: 10  FSS: 46

## 2019-04-06 ENCOUNTER — Encounter: Payer: Self-pay | Admitting: Neurology

## 2019-04-06 ENCOUNTER — Ambulatory Visit (INDEPENDENT_AMBULATORY_CARE_PROVIDER_SITE_OTHER): Payer: BLUE CROSS/BLUE SHIELD | Admitting: Neurology

## 2019-04-06 ENCOUNTER — Other Ambulatory Visit: Payer: Self-pay

## 2019-04-06 DIAGNOSIS — R0683 Snoring: Secondary | ICD-10-CM

## 2019-04-06 DIAGNOSIS — Z6841 Body Mass Index (BMI) 40.0 and over, adult: Secondary | ICD-10-CM

## 2019-04-06 DIAGNOSIS — G4719 Other hypersomnia: Secondary | ICD-10-CM

## 2019-04-06 DIAGNOSIS — R0681 Apnea, not elsewhere classified: Secondary | ICD-10-CM

## 2019-04-06 NOTE — Patient Instructions (Signed)
Given verbally, during today's virtual video-based encounter, with verbal feedback received.   

## 2019-04-06 NOTE — Progress Notes (Signed)
Peter FoleySaima Laia Wiley, MD, PhD [facility] [address], [address] [address] [place], [place] [address]   Virtual Visit via Video Note on 04/06/2019:  I connected with Peter Herring on 04/06/19 at 3:30 by a video enabled telemedicine application and verified that I am speaking with the correct person using two identifiers.   I discussed the limitations of evaluation and management by telemedicine and the availability of in person appointments. The patient expressed understanding and agreed to proceed.  History of Present Illness:   [name] is a 33 year old right-handed man with an underlying medical history of hypertension, HIV disease and morbid obesity with a BMI of over 40, with whom I am conducting a virtual, video based new patient visit via [email or web address] in lieu of a face-to-face visit for evaluation of his sleep disorder, in particular, evaluation for underlying obstructive sleep apnea. The patient is unaccompanied today and joins via cell phone from home after he initially joined in his car but he was driving and I asked him to pull over first, we changed his appointment time and were able to give him a later appointment time for the same date and he was able to join from home, safely, not driving. I'm located in my office. He is referred by his ID NP, Jeanine LuzGregory Calone and I reviewed his office note from 03/26/2019.  He reports snoring and witnessed apneas. He does not wake up rested. He is tired during the day. His bedtime varies. Rise time is between 7:30 and 8:30. He has been trying to lose weight. He has lost about 20 pounds in the last couple of months but weight loss has stagnated since his cholecystectomy on 02/16/2019. He lives with his spouse, no children, they have 1 dog in the household, not in the bedroom at night, he does not watch TV in the bedroom. He does not drink caffeine on a day-to-day basis, rare alcohol and is a nonsmoker. 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 His  Epworth sleepiness score is 10 out of 24, fatigue score is 46 out of 63.   His Past Medical History Is Significant For: Past Medical History:  Diagnosis Date  . HIV infection (HCC)   . Hypertension     His Past Surgical History Is Significant For: Past Surgical History:  Procedure Laterality Date  . CHOLECYSTECTOMY N/A 02/16/2019   Procedure: LAPAROSCOPIC CHOLECYSTECTOMY;  Surgeon: Darnell LevelGerkin, Todd, MD;  Location: [facility];  Service: General;  Laterality: N/A;    His Family History Is Significant For: Family History  Problem Relation Age of Onset  . Cancer Mother   . Diabetes Mother   . Diabetes Father   . Heart disease Father   . Diabetes Sister     His Social History Is Significant For: Social History   Socioeconomic History  . Marital status: Married    Spouse name: Not on file  . Number of children: Not on file  . Years of education: Not on file  . Highest education level: Not on file  Occupational History  . Occupation: Art therapistGeneral Manager  Social Needs  . Financial resource strain: Not on file  . Food insecurity:    Worry: Not on file    Inability: Not on file  . Transportation needs:    Medical: Not on file    Non-medical: Not on file  Tobacco Use  . Smoking status: Former Smoker    Packs/day: 0.50    Years: 12.00    Pack years: 6.00  Types: Cigarettes  . Smokeless tobacco: Never Used  Substance and Sexual Activity  . Alcohol use: Never    Alcohol/week: 0.0 standard drinks    Frequency: Never  . Drug use: Never  . Sexual activity: Not on file  Lifestyle  . Physical activity:    Days per week: Not on file    Minutes per session: Not on file  . Stress: Not on file  Relationships  . Social connections:    Talks on phone: Not on file    Gets together: Not on file    Attends religious service: Not on file    Active member of club or organization: Not on file    Attends meetings of clubs or organizations: Not on file    Relationship status: Not on file   Other Topics Concern  . Not on file  Social History Narrative  . Not on file    His Allergies Are:  No Known Allergies:   His Current Medications Are:  Outpatient Encounter Medications as of 04/06/2019  Medication Sig  . bictegravir-emtricitabine-tenofovir AF (BIKTARVY) 50-200-25 MG TABS tablet Take 1 tablet by mouth daily.   No facility-administered encounter medications on file as of 04/06/2019.   :   Review of Systems:  Out of a complete 14 point review of systems, all are reviewed and negative with the exception of these symptoms as listed below:  Observations/Objective: His most recent vital signs available for my review of in the chart are from 03/26/2019: Blood pressure 92/72, pulse 99, temperature 98.6, weight 299 pounds for a BMI of 41.7.         Her most recent weight at home by self report is 299 lb yesterday, neck circumference by self-report is 19 inches. On examination, he is pleasant, conversant, in no acute distress. Face is symmetric with normal facial animation noted, extraocular movements are well preserved without nystagmus. There are no obvious gaze limitations. Hearing is grossly intact. Airway examination reveals a fairly crowded looking airway, Mallampati class III. Thicker soft palate and wider tongue noted, tonsils not fully visualized. Speech is clear without dysarthria, hypophonia or voice tremor noted. Shoulder height is equal. On motor examination, he has no drift, no postural or action tremor. Cerebellar testing shows no dysmetria or intention tremor on finger to nose testing. He stands without difficulty, posture is normal, Romberg negative.  Assessment and Plan:  [name] is a 33 year old right-handed man with an underlying medical history of hypertension, HIV disease and morbid obesity with a BMI of over 40, with whom I am conducting a virtual, video based new patient visit via Webex in lieu of a face-to-face visit for evaluation of an underlying  organic sleep disorder, in particular, concern for obstructive sleep apnea. The patient's medical history and physical exam (albeit limited with current video-based evaluation) are concerning for a diagnosis of obstructive sleep apnea. I discussed with the patient the diagnosis of OSA, its prognosis and treatment options. I explained in particular the risks and ramifications of untreated moderate to severe OSA, especially with respect to developing cardiovascular disease down the Road, including congestive heart failure, difficult to treat hypertension, cardiac arrhythmias, or stroke. Even type 2 diabetes has, in part, been linked to untreated OSA. Symptoms of untreated OSA may include daytime sleepiness, memory problems, mood irritability and mood disorder such as depression and anxiety, lack of energy, as well as recurrent headaches, especially morning headaches. We talked about the importance of weight control. We talked about the importance  of maintaining good sleep hygiene. I recommended the following at this time: home sleep test.  I explained the sleep test procedure to the patient and also outlined possible treatment options of OSA, including the use of a custom-made dental device (which would require a referral to a specialist dentist), upper airway surgical options, (such as UPPP, which would involve a referral to an ENT). I also explained the CPAP vs. AutoPAP treatment option to the patient, who indicated that he would be willing to try CPAP if the need arises. I answered all his questions today and the patient was in agreement. I plan to see the patient back after the sleep study is completed and encouraged him to call with any interim questions, concerns, problems or updates.   Peter Foley, MD, PhD  Follow Up Instructions: 1. HST, sleep lab staff will reach out to patient to arrange for either sending the unit to home address or a "drive by pickup" and for tutorial, making sure patient is  comfortable with the unit and usage, and return of equipment, if necessary.  2. Consider AutoPap therapy, if home sleep test positive for obstructive sleep apnea, patient agreeable. 3. We talked about alternative treatment options and current limitations, due to virus pandemic.  4. Follow-up after starting AutoPap therapy, follow-up to be scheduled according to set-up date, typically within 31 to 89 days post treatment start. 5. Pursue healthy lifestyle, good sleep hygiene, healthy weight. 6. Call for any interim questions or concerns.    I discussed the assessment and treatment plan with the patient. The patient was provided an opportunity to ask questions and all were answered. The patient agreed with the plan and demonstrated an understanding of the instructions.   The patient was advised to call back or seek an in-person evaluation if the symptoms worsen or if the condition fails to improve as anticipated.    I provided 30 minutes of non-face-to-face time during this encounter.   Peter Foley, MD

## 2019-05-13 ENCOUNTER — Ambulatory Visit (INDEPENDENT_AMBULATORY_CARE_PROVIDER_SITE_OTHER): Payer: BC Managed Care – PPO | Admitting: Neurology

## 2019-05-13 ENCOUNTER — Other Ambulatory Visit: Payer: Self-pay

## 2019-05-13 DIAGNOSIS — R0681 Apnea, not elsewhere classified: Secondary | ICD-10-CM

## 2019-05-13 DIAGNOSIS — G4719 Other hypersomnia: Secondary | ICD-10-CM

## 2019-05-13 DIAGNOSIS — R0683 Snoring: Secondary | ICD-10-CM

## 2019-05-13 DIAGNOSIS — G4710 Hypersomnia, unspecified: Secondary | ICD-10-CM | POA: Diagnosis not present

## 2019-05-18 ENCOUNTER — Telehealth: Payer: Self-pay

## 2019-05-18 NOTE — Procedures (Signed)
Patient Information     First Name: Marcial Pacasimothy Last Name: Tennis ShipHorne ID: [id number]  Birth Date: Dec 04, 1985 Age: 89 Gender:  Referring Provider: Jeanine LuzGregory Calone, NP BMI:           Sleep Study Information     Study Date: May 14, 2019 S/H/A Version: 5.1.77.7 / 4.0.1515 / 5277  History:      33 year old man with a history of hypertension, HIV disease and morbid obesity, who reports snoring and witnessed apneas. He does not wake up rested.    Summary & Diagnosis:    Primary Snoring  Recommendations:     This home sleep test does not demonstrate any significant obstructive or central sleep disordered breathing. Some snoring was noted, appeared to be mostly in the mild range. Overall AHI was borderline at 4.9/hour, O2 nadir 93%. His snoring and borderline AHI will likely improve with weight loss, which will be recommended, as well as avoiding the supine sleep position. Other causes of the patient's symptoms, including circadian rhythm disturbances, an underlying mood disorder, medication effect and/or an underlying medical problem cannot be ruled out based on this test. Clinical correlation is recommended. The patient should be cautioned not to drive, work at heights, or operate dangerous or heavy equipment when tired or sleepy. Review and reiteration of good sleep hygiene measures should be pursued with any patient. The patient can follow up with his referring provider, who will be notified of the test results.   I certify that I have reviewed the raw data recording prior to the issuance of this report in accordance with the standards of the American Academy of Sleep Medicine (AASM).  Huston FoleySaima Jesyka Slaght, MD, PhD Diplomat, ABPN (Neurology and Sleep)                 Sleep Summary  Oxygen Saturation Statistics   Start Study Time: End Study Time: Total Recording Time: 12:49:14AM 8:42:00 AM 7 hrs, 52min  Total Sleep Time Inconclusive REM Detection 6 hrs, 9 min    Mean: 97 Minimum: 93 Maximum:  99  Mean of Desaturations Nadirs (%):   95  Oxygen Desatur. %: 4-9 10-20 >20 Total  Events Number Total  3 100.0  0 0.0  0 0.0  3 100.0  Oxygen Saturation: <90 <=88 <85 <80 <70  Duration (minutes): Sleep % 0.0 0.0 0.0 0.0 0.0 0.0 0.0 0.0 0.0 0.0     Respiratory Indices      Total Events REM NREM All Night  pRDI:  19  pAHI:  14 ODI:  3  pAHIc:  3  % CSR: 0.0 N/A N/A N/A N/A N/A N/A N/A N/A 6.6 4.9 1.1 1.1       Pulse Rate Statistics during Sleep (BPM)      Mean: 76 Minimum: 62 Maximum: 105    Indices are calculated using technically valid sleep time of  2 hrs, 51 min. pRDI/pAHI are calculated using oxi desaturations ? 3% Sit N/A  Wake  REM Body Position Statistics  Position Supine Prone Right Left Non-Supine  Sleep (min) 7.0 191.0 7.0 164.8 362.8  Sleep % 1.9 51.7 1.9 44.6 98.1  pRDI N/A 6.7 N/A 5.0 6.2  pAHI N/A 6.0 N/A 2.9 4.4  ODI N/A 0.0 N/A 1.4 0.7     Snoring Statistics Snoring Level (dB) >40 >50 >60 >70 >80 >Threshold (45)  Sleep (min) 48.1 2.9 1.4 0.0 0.0 5.8  Sleep % 13.0 0.8 0.4 0.0 0.0 1.6    Mean: 40 dB Sleep Stages  Chart   * Reference values are according to AASM guidelines

## 2019-05-18 NOTE — Telephone Encounter (Signed)
I called pt. I advised pt that Dr. Rexene Alberts reviewed pt's sleep study and found that pt does not have significant osa but had mild snoring. Dr. Rexene Alberts recommends that pt avoid sleeping on his back and pursue weight loss. I reviewed sleep hygiene recommendations with the pt, including trying to keep a regular sleep wake schedule, avoiding electronics in the bedroom, keeping the bedroom cool, dark, and quiet, and avoiding eating or exercising within 2 hours of bedtime as well as eating in the middle of the night. I advised pt to keep pets out of the bedroom. I discussed with pt the importance of stress relief and to try meditation, deep breathing exercises, and/or a white noise machine or fan to diffuse other noise distractors. I advised pt to not drink alcohol before bedtime and to never mix alcohol and sedating medications. Pt was advised to avoid narcotic pain medication close to bedtime. I advised pt that a copy of these sleep study results will be sent to Peter Po, NP. Pt verbalized understanding of results. Pt had no questions at this time but was encouraged to call back if questions arise.

## 2019-05-18 NOTE — Telephone Encounter (Signed)
-----   Message from Star Age, MD sent at 05/18/2019 10:32 AM EDT ----- Patient referred by Mauricio Po, NP, seen by me on 04/06/19 for VV, HST on 05/14/19.   Please call and notify the patient that the recent home sleep test did not show any significant obstructive sleep apnea. Snoring was noted, appeared to be mild. AHI was borderline at 4.9/hour, O2 stayed good, nadir of 93%. PAP is not warranted and weight loss will likely reduce AHI and snoring, also avoidance of supine sleep position.  Please remind patient to try to maintain good sleep hygiene, which means: Keep a regular sleep and wake schedule and make enough time for sleep (7 1/2 to 8 1/2 hours for the average adult), try not to exercise or have a meal within 2 hours of your bedtime, try to keep your bedroom conducive for sleep, that is, cool and dark, without light distractors such as an illuminated alarm clock, and refrain from watching TV right before sleep or in the middle of the night and do not keep the TV or radio on during the night. If a nightlight is used, have it away from the visual field. Also, try not to use or play on electronic devices at bedtime, such as your cell phone, tablet PC or laptop. If you like to read at bedtime on an electronic device, try to dim the background light as much as possible. Do not eat in the middle of the night. Keep pets away from the bedroom environment. For stress relief, try meditation, deep breathing exercises (there are many books and CDs available), a white noise machine or fan can help to diffuse other noise distractors, such as traffic noise. Do not drink alcohol before bedtime, as it can disturb sleep and cause middle of the night awakenings. Never mix alcohol and sedating medications! Avoid narcotic pain medication close to bedtime, as opioids/narcotics can suppress breathing drive and breathing effort.    Patient can follow up with the referring provider.  Thanks,  Star Age, MD, PhD [facility])

## 2019-05-18 NOTE — Progress Notes (Signed)
Patient referred by Mauricio Po, NP, seen by me on 04/06/19 for VV, HST on 05/14/19.   Please call and notify the patient that the recent home sleep test did not show any significant obstructive sleep apnea. Snoring was noted, appeared to be mild. AHI was borderline at 4.9/hour, O2 stayed good, nadir of 93%. PAP is not warranted and weight loss will likely reduce AHI and snoring, also avoidance of supine sleep position.  Please remind patient to try to maintain good sleep hygiene, which means: Keep a regular sleep and wake schedule and make enough time for sleep (7 1/2 to 8 1/2 hours for the average adult), try not to exercise or have a meal within 2 hours of your bedtime, try to keep your bedroom conducive for sleep, that is, cool and dark, without light distractors such as an illuminated alarm clock, and refrain from watching TV right before sleep or in the middle of the night and do not keep the TV or radio on during the night. If a nightlight is used, have it away from the visual field. Also, try not to use or play on electronic devices at bedtime, such as your cell phone, tablet PC or laptop. If you like to read at bedtime on an electronic device, try to dim the background light as much as possible. Do not eat in the middle of the night. Keep pets away from the bedroom environment. For stress relief, try meditation, deep breathing exercises (there are many books and CDs available), a white noise machine or fan can help to diffuse other noise distractors, such as traffic noise. Do not drink alcohol before bedtime, as it can disturb sleep and cause middle of the night awakenings. Never mix alcohol and sedating medications! Avoid narcotic pain medication close to bedtime, as opioids/narcotics can suppress breathing drive and breathing effort.    Patient can follow up with the referring provider.  Thanks,  Star Age, MD, PhD [facility])

## 2019-06-02 MED FILL — BIKTARVY 50-200-25 MG TABS: 50-200-25 | 30 days supply | Qty: 30 | Fill #3

## 2019-06-17 ENCOUNTER — Telehealth: Payer: Self-pay | Admitting: Family

## 2019-06-17 NOTE — Telephone Encounter (Signed)
COVID-19 Pre-Screening Questions:06/17/19 ° ° °Do you currently have a fever (>100 °F), chills or unexplained body aches? NO ° °• Are you currently experiencing new cough, shortness of breath, sore throat, runny nose? NO °•  °Have you recently travelled outside the state of [place] in the last 14 days?NO °•  °Have you been in contact with someone that is currently pending confirmation of Covid19 testing or has been confirmed to have the Covid19 virus?  NO ° °**If the patient answers NO to ALL questions -  advise the patient to please call the clinic before coming to the office should any symptoms develop.  ° °

## 2019-06-18 ENCOUNTER — Encounter: Payer: Self-pay | Admitting: Family

## 2019-06-18 ENCOUNTER — Other Ambulatory Visit: Payer: Self-pay

## 2019-06-18 ENCOUNTER — Ambulatory Visit (INDEPENDENT_AMBULATORY_CARE_PROVIDER_SITE_OTHER): Payer: BC Managed Care – PPO | Admitting: Family

## 2019-06-18 VITALS — BP 159/109 | HR 93 | Temp 97.7°F | Wt 315.0 lb

## 2019-06-18 DIAGNOSIS — I10 Essential (primary) hypertension: Secondary | ICD-10-CM | POA: Diagnosis not present

## 2019-06-18 DIAGNOSIS — Z113 Encounter for screening for infections with a predominantly sexual mode of transmission: Secondary | ICD-10-CM | POA: Diagnosis not present

## 2019-06-18 DIAGNOSIS — Z0000 Encounter for general adult medical examination without abnormal findings: Secondary | ICD-10-CM

## 2019-06-18 DIAGNOSIS — B20 Human immunodeficiency virus [HIV] disease: Secondary | ICD-10-CM | POA: Diagnosis not present

## 2019-06-18 MED ORDER — BICTEGRAVIR-EMTRICITAB-TENOFOV 50-200-25 MG PO TABS: 1.0000 | ORAL_TABLET | Freq: Every day | ORAL | 5 refills | Status: DC

## 2019-06-18 NOTE — Assessment & Plan Note (Signed)
   Discussed importance of safe sexual practice to reduce risk of acquisition/transmission of STI.  Condoms declined.  All immunizations up-to-date per recommendations.

## 2019-06-18 NOTE — Patient Instructions (Signed)
Nice to see you!  Continue to take your [place] daily.  Refills have been sent to the pharmacy.  We will check your blood work today and get the results to you.  Plan for follow up in 4 months or sooner if needed.   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  Have a great day and stay safe!

## 2019-06-18 NOTE — Progress Notes (Signed)
Subjective:    Patient ID: Peter Herring, male    DOB: 02/02/1986, 33 y.o.   MRN: [id number]  Chief Complaint  Patient presents with  . HIV Positive/AIDS     HPI:  Peter Herring is a 33 y.o. male his HIV disease who was last seen in the office on 03/26/2019 for routine follow-up with good adherence and tolerance to his ART regimen of Biktarvy.  Blood work at the time showed a CD4 count of 1290 and a viral load that was undetectable.  All immunizations are up-to-date per recommendations.  No new blood work completed since previous office visit.  Peter Herring continues to take his Biktarvy as prescribed with no adverse side effects or missed doses.  Overall feeling well although does have some fatigue. Denies fevers, chills, night sweats, headaches, changes in vision, neck pain/stiffness, nausea, diarrhea, vomiting, lesions or rashes.  Peter Her[facility] and has no problems obtaining his medication from the pharmacy.  Denies feelings of being down, depressed, or hopeless recently.  He has been at home more recently and currently unemployed and actively seeking work.  Remains sexually active and uses condoms.  No new partners.  No recreational or illicit drug use, tobacco use, with occasional alcohol consumption. Going to start walking more as he recently gained weight.    No Known Allergies    Outpatient Medications Prior to Visit  Medication Sig Dispense Refill  . bictegravir-emtricitabine-tenofovir AF (BIKTARVY) 50-200-25 MG TABS tablet Take 1 tablet by mouth daily. 30 tablet 5   No facility-administered medications prior to visit.      Past Medical History:  Diagnosis Date  . HIV infection (HCC)   . Hypertension      Past Surgical History:  Procedure Laterality Date  . CHOLECYSTECTOMY N/A 02/16/2019   Procedure: LAPAROSCOPIC CHOLECYSTECTOMY;  Surgeon: Darnell LevelGerkin, Todd, MD;  Location: [facility];  Service: General;  Laterality: N/A;       Review of Systems  Constitutional: Negative for appetite change, chills, fatigue, fever and unexpected weight change.  Eyes: Negative for visual disturbance.  Respiratory: Negative for cough, chest tightness, shortness of breath and wheezing.   Cardiovascular: Negative for chest pain and leg swelling.  Gastrointestinal: Negative for abdominal pain, constipation, diarrhea, nausea and vomiting.  Genitourinary: Negative for dysuria, flank pain, frequency, genital sores, hematuria and urgency.  Skin: Negative for rash.  Allergic/Immunologic: Negative for immunocompromised state.  Neurological: Negative for dizziness and headaches.      Objective:    BP (!) 159/109   Pulse 93   Temp 97.7 F (36.5 C) (Oral)   Wt (!) 315 lb (142.9 kg)   BMI 43.93 kg/m  Nursing note and vital signs reviewed.  Physical Exam Constitutional:      General: He is not in acute distress.    Appearance: He is well-developed. He is obese.  Eyes:     Conjunctiva/sclera: Conjunctivae normal.  Neck:     Musculoskeletal: Neck supple.  Cardiovascular:     Rate and Rhythm: Normal rate and regular rhythm.     Heart sounds: Normal heart sounds. No murmur. No friction rub. No gallop.   Pulmonary:     Effort: Pulmonary effort is normal. No respiratory distress.     Breath sounds: Normal breath sounds. No wheezing or rales.  Chest:     Chest wall: No tenderness.  Abdominal:     General: Bowel sounds are normal.     Palpations: Abdomen is soft.  Tenderness: There is no abdominal tenderness.  Lymphadenopathy:     Cervical: No cervical adenopathy.  Skin:    General: Skin is warm and dry.     Findings: No rash.  Neurological:     Mental Status: He is alert and oriented to person, place, and time.  Psychiatric:        Behavior: Behavior normal.        Thought Content: Thought content normal.        Judgment: Judgment normal.     Depression screen [facility] 2/9 06/18/2019 03/26/2019 12/25/2018 06/10/2018   Decreased Interest 0 0 0 0  Down, Depressed, Hopeless 0 0 0 0  PHQ - 2 Score 0 0 0 0       Assessment & Plan:   Problem List Items Addressed This Visit      Cardiovascular and Mediastinum   Essential hypertension    Blood pressure remains elevated above goal 140/90 likely multifactorial.  He has warning/red flag signs/symptoms at present time including headaches or blurred vision.  Previously maintained on lisinopril.  Plan nurse visit to recheck blood pressure and if remains elevated recommend restarting medication.  Recommend lifestyle management through nutrition and physical activity.        Other   HIV disease [facility])    Peter Herring has well-controlled HIV disease with good adherence and tolerance to his ART regimen of Biktarvy.  He has no signs/symptoms of opportunistic infection or progressive HIV disease at present.  He has no problems obtaining his medication from the pharmacy.  Check blood work today.  Continue current dose of Biktarvy.  Plan for follow-up in 4 months or sooner if needed with lab work 1 to 2 weeks prior to appointment or same day.      Relevant Medications   bictegravir-emtricitabine-tenofovir AF (BIKTARVY) 50-200-25 MG TABS tablet   Other Relevant Orders   COMPLETE METABOLIC PANEL WITH GFR   HIV-1 RNA quant-no reflex-bld   T-helper cell (CD4)- ([facility] clinic only)   Healthcare maintenance     Discussed importance of safe sexual practice to reduce risk of acquisition/transmission of STI.  Condoms declined.  All immunizations up-to-date per recommendations.       Other Visit Diagnoses    Screening for STDs (sexually transmitted diseases)    -  Primary   Relevant Orders   RPR       I am having Peter Herring maintain his bictegravir-emtricitabine-tenofovir AF.   Meds ordered this encounter  Medications  . bictegravir-emtricitabine-tenofovir AF (BIKTARVY) 50-200-25 MG TABS tablet    Sig: Take 1 tablet by mouth daily.    Dispense:  30 tablet     Refill:  5    Order Specific Question:   Supervising Provider    Answer:   Carlyle Basques [[id number]]     Follow-up: Return in about 4 months (around 10/19/2019), or if symptoms worsen or fail to improve.   Terri Piedra, MSN, FNP-C Nurse Practitioner [facility] for Infectious Disease [place] number: [phone]

## 2019-06-18 NOTE — Assessment & Plan Note (Signed)
Blood pressure remains elevated above goal 140/90 likely multifactorial.  He has warning/red flag signs/symptoms at present time including headaches or blurred vision.  Previously maintained on lisinopril.  Plan nurse visit to recheck blood pressure and if remains elevated recommend restarting medication.  Recommend lifestyle management through nutrition and physical activity.

## 2019-06-18 NOTE — Assessment & Plan Note (Signed)
Peter Herring has well-controlled HIV disease with good adherence and tolerance to his ART regimen of Biktarvy.  He has no signs/symptoms of opportunistic infection or progressive HIV disease at present.  He has no problems obtaining his medication from the pharmacy.  Check blood work today.  Continue current dose of Biktarvy.  Plan for follow-up in 4 months or sooner if needed with lab work 1 to 2 weeks prior to appointment or same day.

## 2019-06-19 LAB — T-HELPER CELL (CD4) - (RCID CLINIC ONLY)
CD4 % Helper T Cell: 46 % (ref 33–65)
CD4 T Cell Abs: 1057 /uL (ref 400–1790)

## 2019-06-24 LAB — COMPLETE METABOLIC PANEL WITH GFR
AG Ratio: 1 (calc) (ref 1.0–2.5)
ALT: 31 U/L (ref 9–46)
AST: 15 U/L (ref 10–40)
Albumin: 3.9 g/dL (ref 3.6–5.1)
Alkaline phosphatase (APISO): 86 U/L (ref 36–130)
BUN: 10 mg/dL (ref 7–25)
CO2: 26 mmol/L (ref 20–32)
Calcium: 9.4 mg/dL (ref 8.6–10.3)
Chloride: 103 mmol/L (ref 98–110)
Creat: 0.97 mg/dL (ref 0.60–1.35)
GFR, Est African American: 117 mL/min/{1.73_m2} (ref >=60)
GFR, Est Non African American: 101 mL/min/{1.73_m2} (ref >=60)
Globulin: 4 g/dL (calc) — ABNORMAL HIGH (ref 1.9–3.7)
Glucose, Bld: 109 mg/dL — ABNORMAL HIGH (ref 65–99)
Potassium: 3.7 mmol/L (ref 3.5–5.3)
Sodium: 138 mmol/L (ref 135–146)
Total Bilirubin: 0.3 mg/dL (ref 0.2–1.2)
Total Protein: 7.9 g/dL (ref 6.1–8.1)

## 2019-06-24 LAB — HIV-1 RNA QUANT-NO REFLEX-BLD
HIV 1 RNA Quant: <20 copies/mL
HIV-1 RNA Quant, Log: <1.3 Log copies/mL

## 2019-06-24 LAB — RPR: RPR Ser Ql: NONREACTIVE

## 2019-06-29 MED FILL — BIKTARVY 50-200-25 MG TABS: 50-200-25 | 30 days supply | Qty: 30 | Fill #0

## 2019-07-20 ENCOUNTER — Telehealth: Payer: Self-pay

## 2019-07-20 ENCOUNTER — Other Ambulatory Visit: Payer: Self-pay

## 2019-07-20 ENCOUNTER — Ambulatory Visit: Payer: BC Managed Care – PPO

## 2019-07-20 VITALS — BP 176/100 | HR 88 | Temp 98.0°F

## 2019-07-20 DIAGNOSIS — Z0000 Encounter for general adult medical examination without abnormal findings: Secondary | ICD-10-CM

## 2019-07-20 NOTE — Progress Notes (Signed)
Patient came into office for BP reading. Per patient BP was elevated during last visit. Denies having any headaches or dizzy spells.  BP: 176/100 P:98 Will inform FNP about patient's BP for today's visit.

## 2019-07-20 NOTE — Telephone Encounter (Signed)
Patient came into office today for BP reading. Patient's BP today was 176/100. Will route message to FNP to advise. [place]

## 2019-07-30 MED FILL — BIKTARVY 50-200-25 MG TABS: 50-200-25 | 30 days supply | Qty: 30 | Fill #1

## 2019-09-02 MED FILL — BIKTARVY 50-200-25 MG TABS: 50-200-25 | 30 days supply | Qty: 30 | Fill #2

## 2019-09-15 DIAGNOSIS — M542 Cervicalgia: Secondary | ICD-10-CM | POA: Diagnosis not present

## 2019-09-15 DIAGNOSIS — M545 Low back pain: Secondary | ICD-10-CM | POA: Diagnosis not present

## 2019-09-15 DIAGNOSIS — M5136 Other intervertebral disc degeneration, lumbar region: Secondary | ICD-10-CM | POA: Diagnosis not present

## 2019-09-15 DIAGNOSIS — M546 Pain in thoracic spine: Secondary | ICD-10-CM | POA: Diagnosis not present

## 2019-09-16 DIAGNOSIS — M542 Cervicalgia: Secondary | ICD-10-CM | POA: Diagnosis not present

## 2019-09-16 DIAGNOSIS — M546 Pain in thoracic spine: Secondary | ICD-10-CM | POA: Diagnosis not present

## 2019-09-16 DIAGNOSIS — M545 Low back pain: Secondary | ICD-10-CM | POA: Diagnosis not present

## 2019-09-16 DIAGNOSIS — M5136 Other intervertebral disc degeneration, lumbar region: Secondary | ICD-10-CM | POA: Diagnosis not present

## 2019-09-17 DIAGNOSIS — M5136 Other intervertebral disc degeneration, lumbar region: Secondary | ICD-10-CM | POA: Diagnosis not present

## 2019-09-17 DIAGNOSIS — M545 Low back pain: Secondary | ICD-10-CM | POA: Diagnosis not present

## 2019-09-17 DIAGNOSIS — M542 Cervicalgia: Secondary | ICD-10-CM | POA: Diagnosis not present

## 2019-09-17 DIAGNOSIS — M546 Pain in thoracic spine: Secondary | ICD-10-CM | POA: Diagnosis not present

## 2019-09-21 DIAGNOSIS — M5136 Other intervertebral disc degeneration, lumbar region: Secondary | ICD-10-CM | POA: Diagnosis not present

## 2019-09-21 DIAGNOSIS — M542 Cervicalgia: Secondary | ICD-10-CM | POA: Diagnosis not present

## 2019-09-21 DIAGNOSIS — M546 Pain in thoracic spine: Secondary | ICD-10-CM | POA: Diagnosis not present

## 2019-09-21 DIAGNOSIS — M545 Low back pain: Secondary | ICD-10-CM | POA: Diagnosis not present

## 2019-09-23 DIAGNOSIS — M545 Low back pain: Secondary | ICD-10-CM | POA: Diagnosis not present

## 2019-09-23 DIAGNOSIS — M542 Cervicalgia: Secondary | ICD-10-CM | POA: Diagnosis not present

## 2019-09-23 DIAGNOSIS — M546 Pain in thoracic spine: Secondary | ICD-10-CM | POA: Diagnosis not present

## 2019-09-23 DIAGNOSIS — M5136 Other intervertebral disc degeneration, lumbar region: Secondary | ICD-10-CM | POA: Diagnosis not present

## 2019-09-24 DIAGNOSIS — M546 Pain in thoracic spine: Secondary | ICD-10-CM | POA: Diagnosis not present

## 2019-09-24 DIAGNOSIS — M5136 Other intervertebral disc degeneration, lumbar region: Secondary | ICD-10-CM | POA: Diagnosis not present

## 2019-09-24 DIAGNOSIS — M542 Cervicalgia: Secondary | ICD-10-CM | POA: Diagnosis not present

## 2019-09-24 DIAGNOSIS — M545 Low back pain: Secondary | ICD-10-CM | POA: Diagnosis not present

## 2019-09-29 DIAGNOSIS — M545 Low back pain: Secondary | ICD-10-CM | POA: Diagnosis not present

## 2019-09-29 DIAGNOSIS — M5136 Other intervertebral disc degeneration, lumbar region: Secondary | ICD-10-CM | POA: Diagnosis not present

## 2019-09-29 DIAGNOSIS — M546 Pain in thoracic spine: Secondary | ICD-10-CM | POA: Diagnosis not present

## 2019-09-29 DIAGNOSIS — M542 Cervicalgia: Secondary | ICD-10-CM | POA: Diagnosis not present

## 2019-09-30 DIAGNOSIS — M542 Cervicalgia: Secondary | ICD-10-CM | POA: Diagnosis not present

## 2019-09-30 DIAGNOSIS — M545 Low back pain: Secondary | ICD-10-CM | POA: Diagnosis not present

## 2019-09-30 DIAGNOSIS — M546 Pain in thoracic spine: Secondary | ICD-10-CM | POA: Diagnosis not present

## 2019-09-30 DIAGNOSIS — M5136 Other intervertebral disc degeneration, lumbar region: Secondary | ICD-10-CM | POA: Diagnosis not present

## 2019-10-02 MED FILL — BIKTARVY 50-200-25 MG TABS: 50-200-25 | 30 days supply | Qty: 30 | Fill #3

## 2019-10-06 DIAGNOSIS — M5136 Other intervertebral disc degeneration, lumbar region: Secondary | ICD-10-CM | POA: Diagnosis not present

## 2019-10-06 DIAGNOSIS — M546 Pain in thoracic spine: Secondary | ICD-10-CM | POA: Diagnosis not present

## 2019-10-06 DIAGNOSIS — M542 Cervicalgia: Secondary | ICD-10-CM | POA: Diagnosis not present

## 2019-10-06 DIAGNOSIS — M545 Low back pain: Secondary | ICD-10-CM | POA: Diagnosis not present

## 2019-10-07 DIAGNOSIS — M542 Cervicalgia: Secondary | ICD-10-CM | POA: Diagnosis not present

## 2019-10-07 DIAGNOSIS — M546 Pain in thoracic spine: Secondary | ICD-10-CM | POA: Diagnosis not present

## 2019-10-07 DIAGNOSIS — M5136 Other intervertebral disc degeneration, lumbar region: Secondary | ICD-10-CM | POA: Diagnosis not present

## 2019-10-07 DIAGNOSIS — M545 Low back pain: Secondary | ICD-10-CM | POA: Diagnosis not present

## 2019-10-08 DIAGNOSIS — M5136 Other intervertebral disc degeneration, lumbar region: Secondary | ICD-10-CM | POA: Diagnosis not present

## 2019-10-08 DIAGNOSIS — M545 Low back pain: Secondary | ICD-10-CM | POA: Diagnosis not present

## 2019-10-08 DIAGNOSIS — M546 Pain in thoracic spine: Secondary | ICD-10-CM | POA: Diagnosis not present

## 2019-10-08 DIAGNOSIS — M542 Cervicalgia: Secondary | ICD-10-CM | POA: Diagnosis not present

## 2019-10-12 DIAGNOSIS — M5136 Other intervertebral disc degeneration, lumbar region: Secondary | ICD-10-CM | POA: Diagnosis not present

## 2019-10-12 DIAGNOSIS — M542 Cervicalgia: Secondary | ICD-10-CM | POA: Diagnosis not present

## 2019-10-12 DIAGNOSIS — M546 Pain in thoracic spine: Secondary | ICD-10-CM | POA: Diagnosis not present

## 2019-10-12 DIAGNOSIS — M545 Low back pain: Secondary | ICD-10-CM | POA: Diagnosis not present

## 2019-10-19 DIAGNOSIS — M5136 Other intervertebral disc degeneration, lumbar region: Secondary | ICD-10-CM | POA: Diagnosis not present

## 2019-10-19 DIAGNOSIS — M542 Cervicalgia: Secondary | ICD-10-CM | POA: Diagnosis not present

## 2019-10-19 DIAGNOSIS — M546 Pain in thoracic spine: Secondary | ICD-10-CM | POA: Diagnosis not present

## 2019-10-19 DIAGNOSIS — M545 Low back pain: Secondary | ICD-10-CM | POA: Diagnosis not present

## 2019-10-21 DIAGNOSIS — M542 Cervicalgia: Secondary | ICD-10-CM | POA: Diagnosis not present

## 2019-10-21 DIAGNOSIS — M545 Low back pain: Secondary | ICD-10-CM | POA: Diagnosis not present

## 2019-10-21 DIAGNOSIS — M546 Pain in thoracic spine: Secondary | ICD-10-CM | POA: Diagnosis not present

## 2019-10-21 DIAGNOSIS — M5136 Other intervertebral disc degeneration, lumbar region: Secondary | ICD-10-CM | POA: Diagnosis not present

## 2019-10-26 ENCOUNTER — Encounter: Payer: Self-pay | Admitting: Family

## 2019-10-26 ENCOUNTER — Other Ambulatory Visit: Payer: Self-pay

## 2019-10-26 ENCOUNTER — Ambulatory Visit (INDEPENDENT_AMBULATORY_CARE_PROVIDER_SITE_OTHER): Payer: BC Managed Care – PPO | Admitting: Family

## 2019-10-26 VITALS — BP 170/100 | HR 91 | Temp 98.2°F | Wt 332.0 lb

## 2019-10-26 DIAGNOSIS — Z0000 Encounter for general adult medical examination without abnormal findings: Secondary | ICD-10-CM

## 2019-10-26 DIAGNOSIS — Z23 Encounter for immunization: Secondary | ICD-10-CM | POA: Diagnosis not present

## 2019-10-26 DIAGNOSIS — B20 Human immunodeficiency virus [HIV] disease: Secondary | ICD-10-CM

## 2019-10-26 DIAGNOSIS — M5136 Other intervertebral disc degeneration, lumbar region: Secondary | ICD-10-CM | POA: Diagnosis not present

## 2019-10-26 DIAGNOSIS — M545 Low back pain: Secondary | ICD-10-CM | POA: Diagnosis not present

## 2019-10-26 DIAGNOSIS — I10 Essential (primary) hypertension: Secondary | ICD-10-CM

## 2019-10-26 DIAGNOSIS — M546 Pain in thoracic spine: Secondary | ICD-10-CM | POA: Diagnosis not present

## 2019-10-26 DIAGNOSIS — M542 Cervicalgia: Secondary | ICD-10-CM | POA: Diagnosis not present

## 2019-10-26 MED ORDER — BICTEGRAVIR-EMTRICITAB-TENOFOV 50-200-25 MG PO TABS: 1.0000 | ORAL_TABLET | Freq: Every day | ORAL | 5 refills | Status: DC

## 2019-10-26 MED ORDER — OLMESARTAN MEDOXOMIL 20 MG PO TABS: 20.0000 mg | ORAL_TABLET | Freq: Every day | ORAL | 2 refills | Status: DC

## 2019-10-26 MED FILL — OLMESARTAN MEDOXOMIL 20 MG: 20 | 30 days supply | Qty: 30 | Fill #0

## 2019-10-26 NOTE — Assessment & Plan Note (Addendum)
Blood pressure elevated above goal 140/90 without medication at the present time.  No warning signs/red flags with headache or changes in vision.  Discussed importance of getting blood pressure under control.  We will start Benicar given cough with lisinopril.  Discussed importance of weight loss to help control blood pressure.  May need referral to nephrology if blood pressure remains poorly controlled.  Plan nurse visit to recheck blood pressure in 1 to 2 weeks.

## 2019-10-26 NOTE — Assessment & Plan Note (Addendum)
   Influenza updated today.    Discussed importance of safe sexual practice to reduce risk of STI.  Condoms declined.

## 2019-10-26 NOTE — Assessment & Plan Note (Signed)
Mr. Fassnacht has well-controlled HIV disease with resistance pattern K103N and good adherence and tolerance to his ART regimen of Biktarvy.  No signs/symptoms of opportunistic infection or progressive HIV disease.  No problems obtaining medication from the pharmacy.  Reviewed previous lab work and discussed plan of care.  Continue current dose of Biktarvy.  Plan for follow-up in 6 months or sooner if needed for HIV.

## 2019-10-26 NOTE — Patient Instructions (Addendum)
Nice to see you.  Continue to take you Biktarvy as prescribed daily.  Refills have been sent to the pharmacy.  Start taking the Benicar for your blood pressure.  Continue to use [email or web address] and MyFitnessPal.   We will check your blood work today.   Plan to follow up for nurse visit in 1-2 weeks to check your blood pressure.  Follow up office visit in 6 months or sooner if needed

## 2019-10-26 NOTE — Assessment & Plan Note (Signed)
Peter Herring has unfortunately gained weight despite current exercise patterns and has a BMI of 46.3 with a weight of 332 pounds.  We discussed various nutritional intakes and exercise patterns with recommendation for lower carbohydrates and determining level of fat that he can tolerate to increase his satiety levels.  Weight gain is likely related to insulin resistance and water retention.  Consider checking free insulin levels and A1c.

## 2019-10-26 NOTE — Progress Notes (Signed)
Subjective:    Patient ID: Peter Herring, male    DOB: [birth year], 33 y.o.   MRN: [id number]  Chief Complaint  Patient presents with  . Follow-up    declined condoms, considering flu shot; will provide info for PCP's in area     HPI:  Peter Herring is a 33 y.o. male with HIV disease who was last seen in the office on 06/18/19 with good adherence and tolerance to his ART regimen of Biktarvity with CD4 count of 1290 and viral load that was undetectable.  Due for blood work today.  Healthcare maintenance due includes influenza vaccination.  Peter Herring continues to take his Biktarvy as prescribed no adverse side effects or missed doses.  Overall feeling well today with no new concerns/complaints.  Has noted his blood pressure being elevated once again and has regained weight since his gallbladder surgery recently. Denies fevers, chills, night sweats, headaches, changes in vision, neck pain/stiffness, nausea, diarrhea, vomiting, lesions or rashes.  Peter Herring has no problems obtaining his medication from the pharmacy and remains covered through [facility].  Denies feelings of being down, depressed, or hopeless recently.  No recreational or illicit drug use, tobacco use, or alcohol consumption recently.  Not currently sexually active.  Wants to get back on something for blood pressure and work on losing weight.  No Known Allergies    Outpatient Medications Prior to Visit  Medication Sig Dispense Refill  . bictegravir-emtricitabine-tenofovir AF (BIKTARVY) 50-200-25 MG TABS tablet Take 1 tablet by mouth daily. 30 tablet 5   No facility-administered medications prior to visit.      Past Medical History:  Diagnosis Date  . HIV infection (HCC)   . Hypertension      Past Surgical History:  Procedure Laterality Date  . CHOLECYSTECTOMY N/A 02/16/2019   Procedure: LAPAROSCOPIC CHOLECYSTECTOMY;  Surgeon: Darnell Level, MD;  Location: [facility];  Service: General;  Laterality: N/A;        Review of Systems  Constitutional: Negative for appetite change, chills, fatigue, fever and unexpected weight change.  Eyes: Negative for visual disturbance.  Respiratory: Negative for cough, chest tightness, shortness of breath and wheezing.   Cardiovascular: Negative for chest pain and leg swelling.  Gastrointestinal: Negative for abdominal pain, constipation, diarrhea, nausea and vomiting.  Genitourinary: Negative for dysuria, flank pain, frequency, genital sores, hematuria and urgency.  Skin: Negative for rash.  Allergic/Immunologic: Negative for immunocompromised state.  Neurological: Negative for dizziness and headaches.      Objective:    BP (!) 170/100   Pulse 91   Temp 98.2 F (36.8 C) (Oral)   Wt (!) 332 lb (150.6 kg)   BMI 46.30 kg/m  Nursing note and vital signs reviewed.  Physical Exam Constitutional:      General: He is not in acute distress.    Appearance: He is well-developed. He is obese.  Eyes:     Conjunctiva/sclera: Conjunctivae normal.  Neck:     Musculoskeletal: Neck supple.  Cardiovascular:     Rate and Rhythm: Normal rate and regular rhythm.     Heart sounds: Normal heart sounds. No murmur. No friction rub. No gallop.   Pulmonary:     Effort: Pulmonary effort is normal. No respiratory distress.     Breath sounds: Normal breath sounds. No wheezing or rales.  Chest:     Chest wall: No tenderness.  Abdominal:     General: Bowel sounds are normal.     Palpations: Abdomen is soft.  Tenderness: There is no abdominal tenderness.  Lymphadenopathy:     Cervical: No cervical adenopathy.  Skin:    General: Skin is warm and dry.     Findings: No rash.  Neurological:     Mental Status: He is alert and oriented to person, place, and time.  Psychiatric:        Behavior: Behavior normal.        Thought Content: Thought content normal.        Judgment: Judgment normal.     [facility] 10/26/2019 06/18/2019 03/26/2019 12/25/2018  06/10/2018  Decreased Interest 0 0 0 0 0  Down, Depressed, Hopeless 0 0 0 0 0  PHQ - 2 Score 0 0 0 0 0       Assessment & Plan:    Patient Active Problem List   Diagnosis Date Noted  . Snoring 03/26/2019  . Abdominal pain, right upper quadrant 02/15/2019  . Cholelithiasis with chronic cholecystitis 02/14/2019  . Hypokalemia 02/14/2019  . Healthcare maintenance 12/25/2018  . HIV disease (HCC) 06/10/2018  . Essential hypertension 06/10/2018  . Obesity, morbid, BMI 40.0-49.9 (HCC) 06/10/2018     Problem List Items Addressed This Visit      Cardiovascular and Mediastinum   Essential hypertension    Blood pressure elevated above goal 140/90 without medication at the present time.  No warning signs/red flags with headache or changes in vision.  Discussed importance of getting blood pressure under control.  We will start Benicar given cough with lisinopril.  Discussed importance of weight loss to help control blood pressure.  May need referral to nephrology if blood pressure remains poorly controlled.  Plan nurse visit to recheck blood pressure in 1 to 2 weeks.      Relevant Medications   olmesartan (BENICAR) 20 MG tablet     Other   HIV disease (HCC) - Primary    Peter Herring has well-controlled HIV disease with resistance pattern K103N and good adherence and tolerance to his ART regimen of Biktarvy.  No signs/symptoms of opportunistic infection or progressive HIV disease.  No problems obtaining medication from the pharmacy.  Reviewed previous lab work and discussed plan of care.  Continue current dose of Biktarvy.  Plan for follow-up in 6 months or sooner if needed for HIV.      Relevant Medications   bictegravir-emtricitabine-tenofovir AF (BIKTARVY) 50-200-25 MG TABS tablet   Other Relevant Orders   COMPLETE METABOLIC PANEL WITH GFR   HIV-1 RNA quant-no reflex-bld   T-helper cell (CD4)- ([facility] clinic only)   Obesity, morbid, BMI 40.0-49.9 (HCC)    Peter Herring has unfortunately gained  weight despite current exercise patterns and has a BMI of 46.3 with a weight of 332 pounds.  We discussed various nutritional intakes and exercise patterns with recommendation for lower carbohydrates and determining level of fat that he can tolerate to increase his satiety levels.  Weight gain is likely related to insulin resistance and water retention.  Consider checking free insulin levels and A1c.      Healthcare maintenance     Declines influenza vaccination today.  Discussed importance of safe sexual practice to reduce risk of STI.  Condoms declined.          I am having Peter Herring start on olmesartan. I am also having him maintain his bictegravir-emtricitabine-tenofovir AF.   Meds ordered this encounter  Medications  . bictegravir-emtricitabine-tenofovir AF (BIKTARVY) 50-200-25 MG TABS tablet    Sig: Take 1 tablet by mouth daily.  Dispense:  30 tablet    Refill:  5    Order Specific Question:   Supervising Provider    Answer:   Carlyle Basques [[id number]]  . olmesartan (BENICAR) 20 MG tablet    Sig: Take 1 tablet (20 mg total) by mouth daily.    Dispense:  30 tablet    Refill:  2    Order Specific Question:   Supervising Provider    Answer:   Carlyle Basques [[id number]]     Follow-up: Return in about 6 months (around 04/24/2020), or if symptoms worsen or fail to improve.   Terri Piedra, MSN, FNP-C Nurse Practitioner [facility] for Infectious Disease [place] number: [phone]

## 2019-10-27 DIAGNOSIS — M542 Cervicalgia: Secondary | ICD-10-CM | POA: Diagnosis not present

## 2019-10-27 DIAGNOSIS — M5136 Other intervertebral disc degeneration, lumbar region: Secondary | ICD-10-CM | POA: Diagnosis not present

## 2019-10-27 DIAGNOSIS — M545 Low back pain: Secondary | ICD-10-CM | POA: Diagnosis not present

## 2019-10-27 DIAGNOSIS — M546 Pain in thoracic spine: Secondary | ICD-10-CM | POA: Diagnosis not present

## 2019-10-27 LAB — T-HELPER CELL (CD4) - (RCID CLINIC ONLY)
CD4 % Helper T Cell: 49 % (ref 33–65)
CD4 T Cell Abs: 1212 /uL (ref 400–1790)

## 2019-11-03 DIAGNOSIS — M5136 Other intervertebral disc degeneration, lumbar region: Secondary | ICD-10-CM | POA: Diagnosis not present

## 2019-11-03 DIAGNOSIS — M546 Pain in thoracic spine: Secondary | ICD-10-CM | POA: Diagnosis not present

## 2019-11-03 DIAGNOSIS — M545 Low back pain: Secondary | ICD-10-CM | POA: Diagnosis not present

## 2019-11-03 DIAGNOSIS — M542 Cervicalgia: Secondary | ICD-10-CM | POA: Diagnosis not present

## 2019-11-03 LAB — HIV-1 RNA QUANT-NO REFLEX-BLD
HIV 1 RNA Quant: <20 copies/mL
HIV-1 RNA Quant, Log: <1.3 Log copies/mL

## 2019-11-03 LAB — COMPLETE METABOLIC PANEL WITH GFR
AG Ratio: 1.1 (calc) (ref 1.0–2.5)
ALT: 32 U/L (ref 9–46)
AST: 18 U/L (ref 10–40)
Albumin: 4 g/dL (ref 3.6–5.1)
Alkaline phosphatase (APISO): 91 U/L (ref 36–130)
BUN: 8 mg/dL (ref 7–25)
CO2: 27 mmol/L (ref 20–32)
Calcium: 9.3 mg/dL (ref 8.6–10.3)
Chloride: 103 mmol/L (ref 98–110)
Creat: 1.07 mg/dL (ref 0.60–1.35)
GFR, Est African American: 104 mL/min/{1.73_m2} (ref >=60)
GFR, Est Non African American: 89 mL/min/{1.73_m2} (ref >=60)
Globulin: 3.5 g/dL (calc) (ref 1.9–3.7)
Glucose, Bld: 121 mg/dL — ABNORMAL HIGH (ref 65–99)
Potassium: 4 mmol/L (ref 3.5–5.3)
Sodium: 139 mmol/L (ref 135–146)
Total Bilirubin: 0.4 mg/dL (ref 0.2–1.2)
Total Protein: 7.5 g/dL (ref 6.1–8.1)

## 2019-11-05 DIAGNOSIS — M545 Low back pain: Secondary | ICD-10-CM | POA: Diagnosis not present

## 2019-11-05 DIAGNOSIS — M542 Cervicalgia: Secondary | ICD-10-CM | POA: Diagnosis not present

## 2019-11-05 DIAGNOSIS — M546 Pain in thoracic spine: Secondary | ICD-10-CM | POA: Diagnosis not present

## 2019-11-05 DIAGNOSIS — M5136 Other intervertebral disc degeneration, lumbar region: Secondary | ICD-10-CM | POA: Diagnosis not present

## 2019-11-10 DIAGNOSIS — M545 Low back pain: Secondary | ICD-10-CM | POA: Diagnosis not present

## 2019-11-10 DIAGNOSIS — M546 Pain in thoracic spine: Secondary | ICD-10-CM | POA: Diagnosis not present

## 2019-11-10 DIAGNOSIS — M5136 Other intervertebral disc degeneration, lumbar region: Secondary | ICD-10-CM | POA: Diagnosis not present

## 2019-11-10 DIAGNOSIS — M542 Cervicalgia: Secondary | ICD-10-CM | POA: Diagnosis not present

## 2019-11-10 MED FILL — BIKTARVY 50-200-25 MG TABS: 50-200-25 | 30 days supply | Qty: 30 | Fill #4

## 2019-11-11 ENCOUNTER — Other Ambulatory Visit: Payer: Self-pay | Admitting: Family

## 2019-11-11 DIAGNOSIS — Z9189 Other specified personal risk factors, not elsewhere classified: Secondary | ICD-10-CM

## 2019-11-11 NOTE — Progress Notes (Unsigned)
fer

## 2019-11-12 DIAGNOSIS — M5136 Other intervertebral disc degeneration, lumbar region: Secondary | ICD-10-CM | POA: Diagnosis not present

## 2019-11-12 DIAGNOSIS — M545 Low back pain: Secondary | ICD-10-CM | POA: Diagnosis not present

## 2019-11-12 DIAGNOSIS — M546 Pain in thoracic spine: Secondary | ICD-10-CM | POA: Diagnosis not present

## 2019-11-12 DIAGNOSIS — M542 Cervicalgia: Secondary | ICD-10-CM | POA: Diagnosis not present

## 2019-11-16 DIAGNOSIS — M546 Pain in thoracic spine: Secondary | ICD-10-CM | POA: Diagnosis not present

## 2019-11-16 DIAGNOSIS — M545 Low back pain: Secondary | ICD-10-CM | POA: Diagnosis not present

## 2019-11-16 DIAGNOSIS — M5136 Other intervertebral disc degeneration, lumbar region: Secondary | ICD-10-CM | POA: Diagnosis not present

## 2019-11-16 DIAGNOSIS — M542 Cervicalgia: Secondary | ICD-10-CM | POA: Diagnosis not present

## 2019-11-17 ENCOUNTER — Encounter: Payer: Self-pay | Admitting: Nurse Practitioner

## 2019-11-17 ENCOUNTER — Other Ambulatory Visit: Payer: Self-pay

## 2019-11-17 ENCOUNTER — Other Ambulatory Visit (INDEPENDENT_AMBULATORY_CARE_PROVIDER_SITE_OTHER): Payer: BC Managed Care – PPO

## 2019-11-17 ENCOUNTER — Ambulatory Visit (INDEPENDENT_AMBULATORY_CARE_PROVIDER_SITE_OTHER): Payer: BC Managed Care – PPO | Admitting: Nurse Practitioner

## 2019-11-17 ENCOUNTER — Other Ambulatory Visit: Payer: BC Managed Care – PPO

## 2019-11-17 VITALS — BP 152/102 | HR 77 | Temp 98.3°F | Ht 71.0 in | Wt 327.5 lb

## 2019-11-17 DIAGNOSIS — Z9189 Other specified personal risk factors, not elsewhere classified: Secondary | ICD-10-CM

## 2019-11-17 DIAGNOSIS — B20 Human immunodeficiency virus [HIV] disease: Secondary | ICD-10-CM | POA: Diagnosis not present

## 2019-11-17 DIAGNOSIS — D649 Anemia, unspecified: Secondary | ICD-10-CM

## 2019-11-17 DIAGNOSIS — Z8371 Family history of colonic polyps: Secondary | ICD-10-CM | POA: Diagnosis not present

## 2019-11-17 DIAGNOSIS — Z21 Asymptomatic human immunodeficiency virus [HIV] infection status: Secondary | ICD-10-CM

## 2019-11-17 DIAGNOSIS — Z129 Encounter for screening for malignant neoplasm, site unspecified: Secondary | ICD-10-CM

## 2019-11-17 DIAGNOSIS — I10 Essential (primary) hypertension: Secondary | ICD-10-CM

## 2019-11-17 DIAGNOSIS — Z803 Family history of malignant neoplasm of breast: Secondary | ICD-10-CM

## 2019-11-17 DIAGNOSIS — Z1211 Encounter for screening for malignant neoplasm of colon: Secondary | ICD-10-CM

## 2019-11-17 DIAGNOSIS — E669 Obesity, unspecified: Secondary | ICD-10-CM

## 2019-11-17 DIAGNOSIS — Z1159 Encounter for screening for other viral diseases: Secondary | ICD-10-CM

## 2019-11-17 LAB — CBC
HCT: 41.6 % (ref 39.0–52.0)
Hemoglobin: 14.1 g/dL (ref 13.0–17.0)
MCHC: 34 g/dL (ref 30.0–36.0)
MCV: 84.5 fl (ref 78.0–100.0)
Platelets: 332 10*3/uL (ref 150.0–400.0)
RBC: 4.92 Mil/uL (ref 4.22–5.81)
RDW: 13.4 % (ref 11.5–15.5)
WBC: 9.6 10*3/uL (ref 4.0–10.5)

## 2019-11-17 MED ORDER — PLENVU 140 G PO SOLR: 1.0000 | Freq: Once | ORAL | 0 refills | Status: AC

## 2019-11-17 MED FILL — PLENVU 140 GM SOLR: 140 | 1 days supply | Qty: 3 | Fill #0

## 2019-11-17 NOTE — Progress Notes (Signed)
11/17/2019 Peter Herring [id number] 1986-11-14   HISTORY OF PRESENT ILLNESS: Peter Herring is a 33 year old male with a past medical history of hypertension, obesity and HIV positive on Biktarvy . S/P cholecystectomy secondary to gallstones 02/2019. He was referred by his primary care provider, Jeanine Luz NP, to schedule a colonoscopy due to being high risk for colorectal cancer. His most recent CD 4 count was 1,212. He denies having any abdominal pain, change in bowel pattern or rectal bleeding. No family history of colorectal cancer. Mother with history of breast cancer. His blood pressure is currently poorly controlled. He was previously on Lisinopril which was changed to Benicar due to ACE associated cough. He developed palpitations shortly after starting Benicar so he stopped it 2 weeks ago. His blood pressure is 152/102 in the office today. No further palpitations. No chest pain. No other complaints today.    CBC Latest Ref Rng & Units 02/15/2019 02/14/2019 12/25/2018  WBC 4.0 - 10.5 K/uL 9.0 10.5 8.0  Hemoglobin 13.0 - 17.0 g/dL 12.7(L) 13.7 13.4  Hematocrit 39.0 - 52.0 % 39.3 43.4 38.7  Platelets 150 - 400 K/uL 304 343 356   CMP Latest Ref Rng & Units 10/26/2019 06/18/2019 03/26/2019  Glucose 65 - 99 mg/dL 494(W) 967(R) 916(B)  BUN 7 - 25 mg/dL 8 10 12   Creatinine 0.60 - 1.35 mg/dL 8.46 6.59  Sodium 135 - 146 mmol/L 139 138 141  Potassium 3.5 - 5.3 mmol/L 4.0 3.7 4.6  Chloride 98 - 110 mmol/L 103 103 107  CO2 20 - 32 mmol/L 27 26 26   Calcium 8.6 - 10.3 mg/dL 9.3 9.4 9.7  Total Protein 6.1 - 8.1 g/dL 7.5 7.9 8.1  Total Bilirubin 0.2 - 1.2 mg/dL 0.4 0.3 0.3  Alkaline Phos 38 - 126 U/L - - -  AST 10 - 40 U/L 18 15 17   ALT 9 - 46 U/L 32 31 61(H)   Lab Results  Component Value Date   CD4TABS 1,212 10/26/2019   CD4TABS 1,057 06/18/2019   CD4TABS 1,290 03/26/2019    Past Medical History:  Diagnosis Date  . HIV infection (HCC)   . Hypertension    Past Surgical  History:  Procedure Laterality Date  . CHOLECYSTECTOMY N/A 02/16/2019   Procedure: LAPAROSCOPIC CHOLECYSTECTOMY;  Surgeon: 06/20/2019, MD;  Location: [facility];  Service: General;  Laterality: N/A;    reports that he has quit smoking. His smoking use included cigarettes. He has a 6.00 pack-year smoking history. He has never used smokeless tobacco. He reports current alcohol use. He reports that he does not use drugs. family history includes Cancer in his mother; Diabetes in his father, mother, and sister; Heart disease in his father. No Known Allergies    Outpatient Encounter Medications as of 11/17/2019  Medication Sig  . bictegravir-emtricitabine-tenofovir AF (BIKTARVY) 50-200-25 MG TABS tablet Take 1 tablet by mouth daily.  02/18/2019 olmesartan (BENICAR) 20 MG tablet Take 1 tablet (20 mg total) by mouth daily.   No facility-administered encounter medications on file as of 11/17/2019.   No Known Allergies Family History  Problem Relation Age of Onset  . Cancer Mother   . Diabetes Mother   . Diabetes Father   . Heart disease Father   . Diabetes Sister    Social History   Socioeconomic History  . Marital status: Married    Spouse name: Not on file  . Number of children: Not on file  . Years of education: Not on  file  . Highest education level: Not on file  Occupational History  . Occupation: Health and safety inspector  Tobacco Use  . Smoking status: Former Smoker    Packs/day: 0.50    Years: 12.00    Pack years: 6.00    Types: Cigarettes  . Smokeless tobacco: Never Used  Substance and Sexual Activity  . Alcohol use: Yes    Alcohol/week: 0.0 standard drinks    Comment: occassionally;   . Drug use: Never  . Sexual activity: Yes    Partners: Male    Comment: declined condoms 10/2019  Other Topics Concern  . Not on file  Social History Narrative  . Not on file   Social Determinants of Health   Financial Resource Strain:   . Difficulty of Paying Living Expenses: Not on file  Food  Insecurity:   . Worried About Charity fundraiser in the Last Year: Not on file  . Ran Out of Food in the Last Year: Not on file  Transportation Needs:   . Lack of Transportation (Medical): Not on file  . Lack of Transportation (Non-Medical): Not on file  Physical Activity:   . Days of Exercise per Week: Not on file  . Minutes of Exercise per Session: Not on file  Stress:   . Feeling of Stress : Not on file  Social Connections:   . Frequency of Communication with Friends and Family: Not on file  . Frequency of Social Gatherings with Friends and Family: Not on file  . Attends Religious Services: Not on file  . Active Member of Clubs or Organizations: Not on file  . Attends Archivist Meetings: Not on file  . Marital Status: Not on file  Intimate Partner Violence:   . Fear of Current or Ex-Partner: Not on file  . Emotionally Abused: Not on file  . Physically Abused: Not on file  . Sexually Abused: Not on file    REVIEW OF SYSTEMS  : All other systems reviewed and negative except where noted in the History of Present Illness.  PHYSICAL EXAM: BP (!) 152/102 (BP Location: Left Arm, Patient Position: Sitting, Cuff Size: Large)   Pulse 77   Temp 98.3 F (36.8 C) (Oral)   Ht 5\' 11"  (1.803 m)   Wt (!) 327 lb 8 oz (148.6 kg)   BMI 45.68 kg/m  General: Well developed 33 year old male in no acute distress Head: Normocephalic and atraumatic Eyes:  Sclerae anicteric,conjunctive pink Ears: Normal auditory acuity Neck: Supple, no masses Mouth: Dentition intact, no ulcers or lesions Lungs: Clear throughout to auscultation Heart: Regular rate and rhythm Abdomen: Soft, nontender, non distended. No masses or hepatomegaly noted. Normal bowel sounds x 4 quads. Laparoscopic scars intact. Rectal: Deferred. Musculoskeletal: Symmetrical with no gross deformities  Skin: No lesions on visible extremities Extremities: No edema  Neurological: Alert oriented x 4, no focal  deficits Psychological:  Alert and cooperative. Normal mood and affect  ASSESSMENT AND PLAN:  17. 33 year old male HIV + at higher risk for colon cancer presents today to schedule a colonoscopy -Colonoscopy benefits and risks discussed including risk with sedation, risk of bleeding, perforation and infection -Further follow up to be determined after colonoscopy completed  2. Low Hg 12.7 -Repeat CBC  3. Hypertension, poorly controlled off antihypertensive medication x 2 weeks  -Patient instructed to contact Mauricio Po ID NP today for blood pressure management     CC:  [place], FNP

## 2019-11-17 NOTE — Patient Instructions (Addendum)
If you are age 35 or older, your body mass index should be between 23-30. Your Body mass index is 45.68 kg/m. If this is out of the aforementioned range listed, please consider follow up with your Primary Care Provider.  If you are age 44 or younger, your body mass index should be between 19-25. Your Body mass index is 45.68 kg/m. If this is out of the aformentioned range listed, please consider follow up with your Primary Care Provider.   Your provider has requested that you go to the basement level for lab work before leaving today. Press "B" on the elevator. The lab is located at the first door on the left as you exit the elevator.  You have been scheduled for a colonoscopy. Please follow written instructions given to you at your visit today.  Please pick up your prep supplies at the pharmacy within the next 1-3 days. If you use inhalers (even only as needed), please bring them with you on the day of your procedure.  1. Call primary care doctor for blood pressure management today.

## 2019-11-18 ENCOUNTER — Telehealth (INDEPENDENT_AMBULATORY_CARE_PROVIDER_SITE_OTHER): Payer: Self-pay | Admitting: Nurse Practitioner

## 2019-11-18 DIAGNOSIS — Z8371 Family history of colonic polyps: Secondary | ICD-10-CM | POA: Insufficient documentation

## 2019-11-18 DIAGNOSIS — Z803 Family history of malignant neoplasm of breast: Secondary | ICD-10-CM | POA: Insufficient documentation

## 2019-11-18 LAB — PSA: PSA: 0.3 ng/mL (ref <=4.0)

## 2019-11-18 MED ORDER — LISINOPRIL 10 MG PO TABS: 10.0000 mg | ORAL_TABLET | Freq: Every day | ORAL | 3 refills | Status: DC

## 2019-11-18 MED FILL — LISINOPRIL 10 MG TABS: 10 | 30 days supply | Qty: 30 | Fill #0

## 2019-11-18 NOTE — Progress Notes (Signed)
Reviewed and discussed with [name], NP and under current guidelines this patient is average risk for CRC. Screening colonoscopy at at 54 would be appropriate.  Peter Herring. Fuller Plan, MD [facility]

## 2019-11-18 NOTE — Progress Notes (Signed)
I called the patient and discussed Dr. Lynne Leader input re HIV positivity does not increase the patient's risk of colorectal cancer therefore a screening colonoscopy would not be completed prior to the age of 57.  The patient denies having any symptoms such as rectal bleeding.  No family history of colorectal cancer.  I called the patient earlier this morning and provided him with Dr. Lynne Leader input.  Patient stated he wishes to proceed with a colonoscopy as he worries about colorectal cancer. (He had a close friend who was young who died from colorectal cancer). He stated his mother had breast cancer. No family history of colon cancer.  Dr. Fuller Plan asked that I contact the pt and inquire if mother had a known gene associated with breast cancer start such as BRA C1 or MTH L1. If so,  he would be a high risk for CRC and screening would be indicated for that reason.  Patient called his mother and we spoke on a teleconference.  His mother stated she was unaware of having any gene such as the BRCA1 or MTH L1.  However, I asked if she is ever had a colonoscopy and she stated she did in the past and was found to have benign colon polyps.  She also stated Heinz's father had benign colon polyps.  The patient is willing to accept any financial burden if his insurance does not cover the cost of a colonoscopy.  He wishes to proceed with a colonoscopy as scheduled.

## 2019-11-18 NOTE — Telephone Encounter (Signed)
Refer to office consult 11/17/2019 I called the patient and discussed Dr. Lynne Leader input re HIV positivity does not increase the patient's risk of colorectal cancer therefore a screening colonoscopy would not be completed prior to the age of 74.  The patient denies having any symptoms such as rectal bleeding.  No family history of colorectal cancer.  I called the patient earlier this morning and provided him with Dr. Lynne Leader input.  Patient stated he wishes to proceed with a colonoscopy as he worries about colorectal cancer. (He had a close friend who was young who died from colorectal cancer). He stated his mother had breast cancer. No family history of colon cancer.  Dr. Fuller Plan asked that I contact the pt and inquire if mother had a known gene associated with breast cancer start such as BRA C1 or MTH L1. If so,  he would be a high risk for CRC and screening would be indicated for that reason.  Patient called his mother and we spoke on a teleconference.  His mother stated she was unaware of having any gene such as the BRCA1 or MTH L1.  However, I asked if she is ever had a colonoscopy and she stated she did in the past and was found to have benign colon polyps.  She also stated Nicolus's father had benign colon polyps.  The patient is willing to accept any financial burden if his insurance does not cover the cost of a colonoscopy.  He wishes to proceed with a colonoscopy as scheduled.

## 2019-11-20 ENCOUNTER — Encounter: Payer: Self-pay | Admitting: Gastroenterology

## 2019-11-20 ENCOUNTER — Ambulatory Visit (INDEPENDENT_AMBULATORY_CARE_PROVIDER_SITE_OTHER): Payer: BC Managed Care – PPO

## 2019-11-20 DIAGNOSIS — Z1159 Encounter for screening for other viral diseases: Secondary | ICD-10-CM

## 2019-11-20 LAB — SARS CORONAVIRUS 2 (TAT 6-24 HRS): SARS Coronavirus 2: NEGATIVE

## 2019-11-23 DIAGNOSIS — M546 Pain in thoracic spine: Secondary | ICD-10-CM | POA: Diagnosis not present

## 2019-11-23 DIAGNOSIS — M542 Cervicalgia: Secondary | ICD-10-CM | POA: Diagnosis not present

## 2019-11-23 DIAGNOSIS — M545 Low back pain: Secondary | ICD-10-CM | POA: Diagnosis not present

## 2019-11-23 DIAGNOSIS — M5136 Other intervertebral disc degeneration, lumbar region: Secondary | ICD-10-CM | POA: Diagnosis not present

## 2019-11-24 ENCOUNTER — Ambulatory Visit (AMBULATORY_SURGERY_CENTER): Payer: BC Managed Care – PPO | Admitting: Gastroenterology

## 2019-11-24 ENCOUNTER — Encounter: Payer: Self-pay | Admitting: Gastroenterology

## 2019-11-24 ENCOUNTER — Other Ambulatory Visit: Payer: Self-pay

## 2019-11-24 VITALS — BP 129/87 | HR 82 | Temp 98.8°F | Resp 16 | Ht 71.0 in | Wt 327.0 lb

## 2019-11-24 DIAGNOSIS — Z8371 Family history of colonic polyps: Secondary | ICD-10-CM

## 2019-11-24 DIAGNOSIS — Z1211 Encounter for screening for malignant neoplasm of colon: Secondary | ICD-10-CM

## 2019-11-24 MED ORDER — SODIUM CHLORIDE 0.9 % IV SOLN: 500.0000 mL | Freq: Once | INTRAVENOUS | Status: DC

## 2019-11-24 NOTE — Patient Instructions (Signed)
HANDOUTS PROVIDED ON: DIVERTICULOSIS & HEMORRHOIDS  You may resume your previous diet and medication schedule.  Thank you for allowing [place] to care for you today!!!  YOU HAD AN ENDOSCOPIC PROCEDURE TODAY AT [facility]:   Refer to the procedure report that was given to you for any specific questions about what was found during the examination.  If the procedure report does not answer your questions, please call your gastroenterologist to clarify.  If you requested that your care partner not be given the details of your procedure findings, then the procedure report has been included in a sealed envelope for you to review at your convenience later.  YOU SHOULD EXPECT: Some feelings of bloating in the abdomen. Passage of more gas than usual.  Walking can help get rid of the air that was put into your GI tract during the procedure and reduce the bloating. If you had a lower endoscopy (such as a colonoscopy or flexible sigmoidoscopy) you may notice spotting of blood in your stool or on the toilet paper. If you underwent a bowel prep for your procedure, you may not have a normal bowel movement for a few days.  Please Note:  You might notice some irritation and congestion in your nose or some drainage.  This is from the oxygen used during your procedure.  There is no need for concern and it should clear up in a day or so.  SYMPTOMS TO REPORT IMMEDIATELY:   Following lower endoscopy (colonoscopy or flexible sigmoidoscopy):  Excessive amounts of blood in the stool  Significant tenderness or worsening of abdominal pains  Swelling of the abdomen that is new, acute  Fever of 100F or higher  For urgent or emergent issues, a gastroenterologist can be reached at any hour by calling [phone].   DIET:  We do recommend a small meal at first, but then you may proceed to your regular diet.  Drink plenty of fluids but you should avoid alcoholic beverages for 24 hours.  ACTIVITY:  You  should plan to take it easy for the rest of today and you should NOT DRIVE or use heavy machinery until tomorrow (because of the sedation medicines used during the test).    FOLLOW UP: Our staff will call the number listed on your records 48-72 hours following your procedure to check on you and address any questions or concerns that you may have regarding the information given to you following your procedure. If we do not reach you, we will leave a message.  We will attempt to reach you two times.  During this call, we will ask if you have developed any symptoms of COVID 19. If you develop any symptoms (ie: fever, flu-like symptoms, shortness of breath, cough etc.) before then, please call [phone].  If you test positive for Covid 19 in the 2 weeks post procedure, please call and report this information to [place].    If any biopsies were taken you will be contacted by phone or by letter within the next 1-3 weeks.  Please call us at [phone] if you have not heard about the biopsies in 3 weeks.    SIGNATURES/CONFIDENTIALITY: You and/or your care partner have signed paperwork which will be entered into your electronic medical record.  These signatures attest to the fact that that the information above on your After Visit Summary has been reviewed and is understood.  Full responsibility of the confidentiality of this discharge information lies with you and/or your care-partner.

## 2019-11-24 NOTE — Progress Notes (Signed)
PT taken to PACU. Monitors in place. VSS. Report given to RN. 

## 2019-11-24 NOTE — Progress Notes (Signed)
Temperature taken by J.B., VS taken by C.W. 

## 2019-11-24 NOTE — Op Note (Signed)
[facility] Patient Name: Peter Herring Procedure Date: 11/24/2019 1:22 PM MRN: [id number] Endoscopist: Ladene Artist , MD Age: 33 Referring MD:  Date of Birth: 18-May-1986 Gender: Male Account #: [id number] Procedure:                Colonoscopy Indications:              Colon cancer screening in patient at increased                            risk: Family history of colon polyps in multiple                            1st-degree relatives Medicines:                Monitored Anesthesia Care Procedure:                Pre-Anesthesia Assessment:                           - Prior to the procedure, a History and Physical                            was performed, and patient medications and                            allergies were reviewed. The patient's tolerance of                            previous anesthesia was also reviewed. The risks                            and benefits of the procedure and the sedation                            options and risks were discussed with the patient.                            All questions were answered, and informed consent                            was obtained. Prior Anticoagulants: The patient has                            taken no previous anticoagulant or antiplatelet                            agents. ASA Grade Assessment: II - A patient with                            mild systemic disease. After reviewing the risks                            and benefits, the patient was deemed in  satisfactory condition to undergo the procedure.                           After obtaining informed consent, the colonoscope                            was passed under direct vision. Throughout the                            procedure, the patient's blood pressure, pulse, and                            oxygen saturations were monitored continuously. The                            Colonoscope was introduced through the anus  and                            advanced to the the cecum, identified by                            appendiceal orifice and ileocecal valve. The                            ileocecal valve, appendiceal orifice, and rectum                            were photographed. The quality of the bowel                            preparation was good. The colonoscopy was performed                            without difficulty. The patient tolerated the                            procedure well. Scope In: 1:39:08 PM Scope Out: 1:53:19 PM Scope Withdrawal Time: 0 hours 12 minutes 6 seconds  Total Procedure Duration: 0 hours 14 minutes 11 seconds  Findings:                 The perianal and digital rectal examinations were                            normal.                           A few small-mouthed diverticula were found in the                            left colon. There was no evidence of diverticular                            bleeding.  Internal hemorrhoids were found during                            retroflexion. The hemorrhoids were small and Grade                            I (internal hemorrhoids that do not prolapse).                           The exam was otherwise without abnormality on                            direct and retroflexion views. Complications:            No immediate complications. Estimated blood loss:                            None. Estimated Blood Loss:     Estimated blood loss: none. Impression:               - Mild diverticulosis in the left colon.                           - Internal hemorrhoids.                           - The examination was otherwise normal on direct                            and retroflexion views.                           - No specimens collected. Recommendation:           - Repeat colonoscopy in 5 years for screening                            purposes.                           - Patient has a contact number  available for                            emergencies. The signs and symptoms of potential                            delayed complications were discussed with the                            patient. Return to normal activities tomorrow.                            Written discharge instructions were provided to the                            patient.                           -  Resume previous diet.                           - Continue present medications. Meryl DareMalcolm T Kameryn Davern, MD 11/24/2019 1:56:51 PM This report has been signed electronically.

## 2019-11-26 ENCOUNTER — Telehealth: Payer: Self-pay | Admitting: *Deleted

## 2019-11-26 NOTE — Telephone Encounter (Signed)
  Follow up Call-  Call back number 11/24/2019  Post procedure Call Back phone  # [phone]  Permission to leave phone message Yes  Some recent data might be hidden     No answer at # given.  LM on VM.

## 2019-11-26 NOTE — Telephone Encounter (Signed)
  Follow up Call-  Call back number 11/24/2019  Post procedure Call Back phone  # [phone]  Permission to leave phone message Yes  Some recent data might be hidden     Patient questions:  Do you have a fever, pain , or abdominal swelling? No. Pain Score  0 *  Have you tolerated food without any problems? Yes.    Have you been able to return to your normal activities? Yes.    Do you have any questions about your discharge instructions: Diet   No. Medications  No. Follow up visit  No.  Do you have questions or concerns about your Care? No.  Actions: * If pain score is 4 or above: No action needed, pain <4.  1. Have you developed a fever since your procedure? no  2.   Have you had an respiratory symptoms (SOB or cough) since your procedure? no  3.   Have you tested positive for COVID 19 since your procedure no  4.   Have you had any family members/close contacts diagnosed with the COVID 19 since your procedure?  no   If yes to any of these questions please route to Joylene John, RN and Alphonsa Gin, Therapist, sports.

## 2019-12-14 MED FILL — BIKTARVY 50-200-25 MG TABS: 50-200-25 | 30 days supply | Qty: 30 | Fill #5

## 2019-12-18 MED FILL — LISINOPRIL 10 MG TABS: 10 | 30 days supply | Qty: 30 | Fill #1

## 2020-01-11 MED FILL — BIKTARVY 50-200-25 MG TABS: 50-200-25 | 30 days supply | Qty: 30 | Fill #0

## 2020-01-11 MED FILL — LISINOPRIL 10 MG TABS: 10 | 30 days supply | Qty: 30 | Fill #2

## 2020-02-10 MED FILL — BIKTARVY 50-200-25 MG TABS: 50-200-25 | 30 days supply | Qty: 30 | Fill #1

## 2020-02-10 MED FILL — LISINOPRIL 10 MG TABS: 10 | 30 days supply | Qty: 30 | Fill #3

## 2020-03-24 MED FILL — BIKTARVY 50-200-25 MG TABS: 50-200-25 | 30 days supply | Qty: 30 | Fill #2

## 2020-04-01 ENCOUNTER — Other Ambulatory Visit: Payer: Self-pay | Admitting: Family

## 2020-04-01 MED FILL — LISINOPRIL 10 MG TABS: 10 | 30 days supply | Qty: 30 | Fill #0

## 2020-04-19 ENCOUNTER — Other Ambulatory Visit: Payer: Self-pay | Admitting: Family

## 2020-04-20 ENCOUNTER — Other Ambulatory Visit: Payer: Self-pay

## 2020-04-20 ENCOUNTER — Other Ambulatory Visit (HOSPITAL_COMMUNITY): Payer: Self-pay | Admitting: Family

## 2020-04-20 ENCOUNTER — Ambulatory Visit (INDEPENDENT_AMBULATORY_CARE_PROVIDER_SITE_OTHER): Payer: BLUE CROSS/BLUE SHIELD | Admitting: Family

## 2020-04-20 ENCOUNTER — Encounter: Payer: Self-pay | Admitting: Family

## 2020-04-20 VITALS — BP 171/105 | HR 92 | Wt 328.0 lb

## 2020-04-20 DIAGNOSIS — I10 Essential (primary) hypertension: Secondary | ICD-10-CM

## 2020-04-20 DIAGNOSIS — Z0000 Encounter for general adult medical examination without abnormal findings: Secondary | ICD-10-CM

## 2020-04-20 DIAGNOSIS — Z113 Encounter for screening for infections with a predominantly sexual mode of transmission: Secondary | ICD-10-CM

## 2020-04-20 DIAGNOSIS — B20 Human immunodeficiency virus [HIV] disease: Secondary | ICD-10-CM | POA: Diagnosis not present

## 2020-04-20 MED ORDER — LISINOPRIL 20 MG PO TABS: 20.0000 mg | ORAL_TABLET | Freq: Every day | ORAL | 5 refills | Status: DC

## 2020-04-20 MED ORDER — BICTEGRAVIR-EMTRICITAB-TENOFOV 50-200-25 MG PO TABS: 1.0000 | ORAL_TABLET | Freq: Every day | ORAL | 5 refills | Status: DC

## 2020-04-20 MED FILL — LISINOPRIL 20 MG TABLET: 20 | 30 days supply | Qty: 30 | Fill #0

## 2020-04-20 NOTE — Patient Instructions (Addendum)
Nice to see you.  We will check your lab work today.   Continue to take your [place] daily.  Increase Lisinopril to 20 mg daily.  Refills have been sent to the pharmacy.   Plan for follow up in 6 months or sooner if needed with lab work on the same day.

## 2020-04-20 NOTE — Progress Notes (Signed)
Subjective:    Patient ID: Peter Herring, male    DOB: [birth year], 34 y.o.   MRN: [id number]  Chief Complaint  Patient presents with  . Follow-up    declined condoms; reports significant stress at work; no missed doses of Biktarvy     HPI:  Peter Herring is a 34 y.o. male with HIV disease who was last seen in the office on 10/26/19 with good adherence and tolerance to his ART regimen of Biktarvy. Viral load at the time was found to be undetectable with CD4 count of 1212. No recent blood work completed. He is here today for routine follow-up.  Peter Herring continues to take his Biktarvy as prescribed with no adverse side effects or missed doses since his last office visit. Overall feeling well today with no new concerns/complaints. Denies fevers, chills, night sweats, headaches, changes in vision, neck pain/stiffness, nausea, diarrhea, vomiting, lesions or rashes.  Peter Herring has no problems obtaining his medication from the pharmacy and remains covered through [facility]. Denies feelings of being down, depressed, or hopeless recently. He is under a lot of stress secondary to multiple jobs that he is currently working and notes his blood pressure has been elevated recently. No recreational or illicit drug use, tobacco use, or alcohol consumption. He is not currently sexually active.    No Known Allergies    Outpatient Medications Prior to Visit  Medication Sig Dispense Refill  . bictegravir-emtricitabine-tenofovir AF (BIKTARVY) 50-200-25 MG TABS tablet Take 1 tablet by mouth daily. 30 tablet 5  . lisinopril (ZESTRIL) 10 MG tablet TAKE 1 TABLET BY MOUTH ONCE A DAY 30 tablet 4   No facility-administered medications prior to visit.     Past Medical History:  Diagnosis Date  . HIV infection (HCC)   . Hypertension      Past Surgical History:  Procedure Laterality Date  . CHOLECYSTECTOMY N/A 02/16/2019   Procedure: LAPAROSCOPIC CHOLECYSTECTOMY;  Surgeon: Darnell Level, MD;   Location: [facility];  Service: General;  Laterality: N/A;     Review of Systems  Constitutional: Negative for appetite change, chills, fatigue, fever and unexpected weight change.  Eyes: Negative for visual disturbance.  Respiratory: Negative for cough, chest tightness, shortness of breath and wheezing.   Cardiovascular: Negative for chest pain and leg swelling.  Gastrointestinal: Negative for abdominal pain, constipation, diarrhea, nausea and vomiting.  Genitourinary: Negative for dysuria, flank pain, frequency, genital sores, hematuria and urgency.  Skin: Negative for rash.  Allergic/Immunologic: Negative for immunocompromised state.  Neurological: Negative for dizziness and headaches.      Objective:    BP (!) 171/105   Pulse 92   Wt (!) 328 lb (148.8 kg)   BMI 45.75 kg/m  Nursing note and vital signs reviewed.  Physical Exam Constitutional:      General: He is not in acute distress.    Appearance: He is well-developed. He is obese.  Eyes:     Conjunctiva/sclera: Conjunctivae normal.  Cardiovascular:     Rate and Rhythm: Normal rate and regular rhythm.     Heart sounds: Normal heart sounds. No murmur. No friction rub. No gallop.   Pulmonary:     Effort: Pulmonary effort is normal. No respiratory distress.     Breath sounds: Normal breath sounds. No wheezing or rales.  Chest:     Chest wall: No tenderness.  Abdominal:     General: Bowel sounds are normal.     Palpations: Abdomen is soft.     Tenderness:  There is no abdominal tenderness.  Musculoskeletal:     Cervical back: Neck supple.  Lymphadenopathy:     Cervical: No cervical adenopathy.  Skin:    General: Skin is warm and dry.     Findings: No rash.  Neurological:     Mental Status: He is alert and oriented to person, place, and time.  Psychiatric:        Behavior: Behavior normal.        Thought Content: Thought content normal.        Judgment: Judgment normal.      Depression screen [facility] 2/9 04/20/2020  10/26/2019 06/18/2019 03/26/2019 12/25/2018  Decreased Interest 0 0 0 0 0  Down, Depressed, Hopeless 0 0 0 0 0  PHQ - 2 Score 0 0 0 0 0       Assessment & Plan:    Patient Active Problem List   Diagnosis Date Noted  . Family history of colonic polyps 11/18/2019  . Family history of breast cancer 11/18/2019  . Snoring 03/26/2019  . Abdominal pain, right upper quadrant 02/15/2019  . Cholelithiasis with chronic cholecystitis 02/14/2019  . Hypokalemia 02/14/2019  . Healthcare maintenance 12/25/2018  . HIV disease ([place]) 06/10/2018  . Essential hypertension 06/10/2018  . Obesity, morbid, BMI 40.0-49.9 ([place]) 06/10/2018     Problem List Items Addressed This Visit      Cardiovascular and Mediastinum   Essential hypertension - Primary    Blood pressure elevated above goal 140/90 likely multifactorial including increased stress at work. No signs/symptoms concerning for intracranial pathology or changes in vision. Discussed importance of working to decrease blood pressure to reduce risk of heart failure and renal disease in the future. Increase lisinopril to 20 mg daily. Encouraged to monitor blood pressure at home and follow low-sodium diet. Plan to send blood pressures through my chart over the next couple weeks with medication adjustment as needed.      Relevant Medications   lisinopril (ZESTRIL) 20 MG tablet     Other   HIV disease [facility])    Peter Herring continues to have well-controlled HIV disease with good adherence and tolerance to his ART regimen of Biktarvy. No signs/symptoms of opportunistic infection or progressive HIV disease. Reviewed previous lab work and discussed the plan of care. Check blood work today. Continue current dose of Biktarvy. Plan for follow-up in 6 months or sooner if needed with lab work on the same day.      Relevant Medications   bictegravir-emtricitabine-tenofovir AF (BIKTARVY) 50-200-25 MG TABS tablet   Other Relevant Orders   COMPLETE METABOLIC PANEL WITH  GFR (Completed)   T-helper cell (CD4)- ([facility] clinic only)   HIV-1 RNA quant-no reflex-bld   Healthcare maintenance     Discussed importance of safe sexual practice to reduce risk of STI. Condoms declined.       Other Visit Diagnoses    Screening for STDs (sexually transmitted diseases)       Relevant Orders   RPR       I have discontinued Ace Elem's lisinopril. I am also having him start on lisinopril. Additionally, I am having him maintain his bictegravir-emtricitabine-tenofovir AF.   Meds ordered this encounter  Medications  . lisinopril (ZESTRIL) 20 MG tablet    Sig: Take 1 tablet (20 mg total) by mouth daily.    Dispense:  30 tablet    Refill:  5    Order Specific Question:   Supervising Provider    Answer:   Carlyle Basques [[id number]]  .  bictegravir-emtricitabine-tenofovir AF (BIKTARVY) 50-200-25 MG TABS tablet    Sig: Take 1 tablet by mouth daily.    Dispense:  30 tablet    Refill:  5    Order Specific Question:   Supervising Provider    Answer:   Judyann Munson [[id number]]     Follow-up: Return in about 6 months (around 10/21/2020), or if symptoms worsen or fail to improve.   Marcos Eke, MSN, FNP-C Nurse Practitioner [facility] Main number: [phone]

## 2020-04-21 LAB — T-HELPER CELL (CD4) - (RCID CLINIC ONLY)
CD4 % Helper T Cell: 50 % (ref 33–65)
CD4 T Cell Abs: 1229 /uL (ref 400–1790)

## 2020-04-21 NOTE — Assessment & Plan Note (Signed)
   Discussed importance of safe sexual practice to reduce risk of STI.  Condoms declined. 

## 2020-04-21 NOTE — Assessment & Plan Note (Signed)
Peter Herring continues to have well-controlled HIV disease with good adherence and tolerance to his ART regimen of Biktarvy. No signs/symptoms of opportunistic infection or progressive HIV disease. Reviewed previous lab work and discussed the plan of care. Check blood work today. Continue current dose of Biktarvy. Plan for follow-up in 6 months or sooner if needed with lab work on the same day.

## 2020-04-21 NOTE — Assessment & Plan Note (Signed)
Blood pressure elevated above goal 140/90 likely multifactorial including increased stress at work. No signs/symptoms concerning for intracranial pathology or changes in vision. Discussed importance of working to decrease blood pressure to reduce risk of heart failure and renal disease in the future. Increase lisinopril to 20 mg daily. Encouraged to monitor blood pressure at home and follow low-sodium diet. Plan to send blood pressures through my chart over the next couple weeks with medication adjustment as needed.

## 2020-04-22 LAB — COMPLETE METABOLIC PANEL WITH GFR
AG Ratio: 1.1 (calc) (ref 1.0–2.5)
ALT: 23 U/L (ref 9–46)
AST: 4 U/L — ABNORMAL LOW (ref 10–40)
Albumin: 4 g/dL (ref 3.6–5.1)
Alkaline phosphatase (APISO): 79 U/L (ref 36–130)
BUN: 10 mg/dL (ref 7–25)
CO2: 31 mmol/L (ref 20–32)
Calcium: 9.4 mg/dL (ref 8.6–10.3)
Chloride: 103 mmol/L (ref 98–110)
Creat: 1.01 mg/dL (ref 0.60–1.35)
GFR, Est African American: 110 mL/min/{1.73_m2} (ref >=60)
GFR, Est Non African American: 95 mL/min/{1.73_m2} (ref >=60)
Globulin: 3.6 g/dL (calc) (ref 1.9–3.7)
Glucose, Bld: 163 mg/dL — ABNORMAL HIGH (ref 65–99)
Potassium: 4.2 mmol/L (ref 3.5–5.3)
Sodium: 140 mmol/L (ref 135–146)
Total Bilirubin: 0.3 mg/dL (ref 0.2–1.2)
Total Protein: 7.6 g/dL (ref 6.1–8.1)

## 2020-04-22 LAB — RPR: RPR Ser Ql: NONREACTIVE

## 2020-04-22 LAB — HIV-1 RNA QUANT-NO REFLEX-BLD
HIV 1 RNA Quant: <20 copies/mL
HIV-1 RNA Quant, Log: <1.3 Log copies/mL

## 2020-04-27 MED FILL — LISINOPRIL 10 MG TABS: 10 | 30 days supply | Qty: 30 | Fill #0

## 2020-04-27 MED FILL — BIKTARVY 50-200-25 MG TABS: 50-200-25 | 30 days supply | Qty: 30 | Fill #3

## 2020-05-11 ENCOUNTER — Emergency Department (HOSPITAL_COMMUNITY): Payer: BLUE CROSS/BLUE SHIELD

## 2020-05-11 ENCOUNTER — Encounter (HOSPITAL_COMMUNITY): Payer: Self-pay | Admitting: Emergency Medicine

## 2020-05-11 ENCOUNTER — Emergency Department (HOSPITAL_COMMUNITY)
Admission: EM | Admit: 2020-05-11 | Discharge: 2020-05-11 | Disposition: A | Discharge status: Home / Self Care | Payer: BLUE CROSS/BLUE SHIELD | Attending: Emergency Medicine | Admitting: Emergency Medicine

## 2020-05-11 ENCOUNTER — Other Ambulatory Visit: Payer: Self-pay

## 2020-05-11 DIAGNOSIS — Z5321 Procedure and treatment not carried out due to patient leaving prior to being seen by health care provider: Secondary | ICD-10-CM | POA: Diagnosis not present

## 2020-05-11 DIAGNOSIS — R0789 Other chest pain: Secondary | ICD-10-CM | POA: Insufficient documentation

## 2020-05-11 LAB — CBC
HCT: 42.3 % (ref 39.0–52.0)
Hemoglobin: 14.1 g/dL (ref 13.0–17.0)
MCH: 28.8 pg (ref 26.0–34.0)
MCHC: 33.3 g/dL (ref 30.0–36.0)
MCV: 86.3 fL (ref 80.0–100.0)
Platelets: 324 10*3/uL (ref 150–400)
RBC: 4.9 MIL/uL (ref 4.22–5.81)
RDW: 12.7 % (ref 11.5–15.5)
WBC: 10.5 10*3/uL (ref 4.0–10.5)
nRBC: 0 % (ref 0.0–0.2)

## 2020-05-11 LAB — TROPONIN I (HIGH SENSITIVITY)
Troponin I (High Sensitivity): 7 ng/L (ref <18)
Troponin I (High Sensitivity): 6 ng/L (ref <18)

## 2020-05-11 LAB — BASIC METABOLIC PANEL
Anion gap: 11 (ref 5–15)
BUN: 6 mg/dL (ref 6–20)
CO2: 26 mmol/L (ref 22–32)
Calcium: 9.2 mg/dL (ref 8.9–10.3)
Chloride: 101 mmol/L (ref 98–111)
Creatinine, Ser: 0.96 mg/dL (ref 0.61–1.24)
GFR calc Af Amer: >60 mL/min (ref >60)
GFR calc non Af Amer: >60 mL/min (ref >60)
Glucose, Bld: 97 mg/dL (ref 70–99)
Potassium: 4 mmol/L (ref 3.5–5.1)
Sodium: 138 mmol/L (ref 135–145)

## 2020-05-11 MED ORDER — SODIUM CHLORIDE 0.9% FLUSH: 3.0000 mL | Freq: Once | INTRAVENOUS | Status: DC

## 2020-05-11 NOTE — ED Notes (Signed)
Pt did not to wait and left.

## 2020-05-11 NOTE — ED Triage Notes (Signed)
Onset one day intermittent chest pain and today constant chest pain currently 7/10 pressure.

## 2020-05-20 MED FILL — CEFDINIR 300 MG CAPSULE: 300 | 7 days supply | Qty: 14 | Fill #0

## 2020-06-01 MED FILL — BIKTARVY 50-200-25 MG TABS: 50-200-25 | 30 days supply | Qty: 30 | Fill #4

## 2020-06-01 MED FILL — LISINOPRIL 10 MG TABS: 10 | 30 days supply | Qty: 30 | Fill #1

## 2020-06-30 MED FILL — BIKTARVY 50-200-25 MG TABS: 50-200-25 | 30 days supply | Qty: 30 | Fill #5

## 2020-06-30 MED FILL — LISINOPRIL 20 MG TABLET: 20 | 30 days supply | Qty: 30 | Fill #1

## 2020-07-19 MED FILL — CLARITHROMYCIN 500 MG TAB: 500 | 14 days supply | Qty: 28 | Fill #0

## 2020-07-19 MED FILL — AMOXICILLIN 500 MG CAPSULE: 500 | 14 days supply | Qty: 28 | Fill #0

## 2020-09-01 MED FILL — BIKTARVY 50-200-25 MG TABS: 50-200-25 | 30 days supply | Qty: 30 | Fill #0

## 2020-09-01 MED FILL — LISINOPRIL 20 MG TABLET: 20 | 30 days supply | Qty: 30 | Fill #2

## 2020-09-29 MED FILL — LISINOPRIL 20 MG TABS: 20 | 30 days supply | Qty: 30 | Fill #3

## 2020-09-29 MED FILL — BIKTARVY 50-200-25 MG TABS: 50-200-25 | 30 days supply | Qty: 30 | Fill #1

## 2020-10-11 ENCOUNTER — Other Ambulatory Visit: Payer: Self-pay

## 2020-10-11 ENCOUNTER — Encounter: Payer: Self-pay | Admitting: Family

## 2020-10-11 ENCOUNTER — Other Ambulatory Visit (HOSPITAL_COMMUNITY): Payer: Self-pay | Admitting: Family

## 2020-10-11 ENCOUNTER — Ambulatory Visit (INDEPENDENT_AMBULATORY_CARE_PROVIDER_SITE_OTHER): Payer: BLUE CROSS/BLUE SHIELD | Admitting: Family

## 2020-10-11 VITALS — BP 169/109 | HR 98 | Temp 97.3°F | Wt 319.0 lb

## 2020-10-11 DIAGNOSIS — B20 Human immunodeficiency virus [HIV] disease: Secondary | ICD-10-CM

## 2020-10-11 DIAGNOSIS — Z113 Encounter for screening for infections with a predominantly sexual mode of transmission: Secondary | ICD-10-CM

## 2020-10-11 DIAGNOSIS — I10 Essential (primary) hypertension: Secondary | ICD-10-CM | POA: Diagnosis not present

## 2020-10-11 MED ORDER — LISINOPRIL 20 MG PO TABS: 20.0000 mg | ORAL_TABLET | Freq: Every day | ORAL | 5 refills | Status: AC

## 2020-10-11 MED ORDER — BICTEGRAVIR-EMTRICITAB-TENOFOV 50-200-25 MG PO TABS: 1.0000 | ORAL_TABLET | Freq: Every day | ORAL | 5 refills | Status: AC

## 2020-10-11 MED FILL — BIKTARVY 50-200-25 MG TABS: 50-200-25 | 30 days supply | Qty: 30 | Fill #1

## 2020-10-11 MED FILL — LISINOPRIL 20 MG TABS: 20 | 30 days supply | Qty: 30 | Fill #3

## 2020-10-11 NOTE — Patient Instructions (Addendum)
Nice to see you.  We will check your lab work today.  Continue to take your Biktarvy and lisinopril.   Refills have been sent to the pharmacy.  Good luck with your surgery.  Plan for follow up in 4 months or sooner if needed with lab work on the same day.

## 2020-10-11 NOTE — Progress Notes (Signed)
Subjective:    Patient ID: Peter Herring, male    DOB: 1986/01/04, 34 y.o.   MRN: [id number]  Chief Complaint  Patient presents with  . Follow-up     HPI:  Peter Herring is a 34 y.o. male with HIV disease who was last seen in the office on 04/20/2020 with good adherence and tolerance to his ART regimen of Biktarvy.  Lab work at the time showed a viral load that was undetectable with CD4 count of 1229.  No recent blood work completed.  Here today for routine follow-up.  Peter Herring continues to take his Peter Herring daily as prescribed with no adverse side effects or missed doses since his last office visit.  Overall feeling well today with no new concerns/complaints.  He is scheduled for bariatric surgery in early December. Denies fevers, chills, night sweats, headaches, changes in vision, neck pain/stiffness, nausea, diarrhea, vomiting, lesions or rashes.  Peter Herring is no problems obtaining medication from the pharmacy and remains covered through [facility].  Denies any feelings of being down, depressed, or hopeless recently.  No recreational or illicit drug use or tobacco use.  Alcohol consumption is occasional.  Declines condoms today.  Interested in receiving flu vaccine.  Due for Covid booster.  Due for routine dental care.   No Known Allergies    Outpatient Medications Prior to Visit  Medication Sig Dispense Refill  . bictegravir-emtricitabine-tenofovir AF (BIKTARVY) 50-200-25 MG TABS tablet Take 1 tablet by mouth daily. 30 tablet 5  . lisinopril (ZESTRIL) 20 MG tablet Take 1 tablet (20 mg total) by mouth daily. 30 tablet 5   No facility-administered medications prior to visit.     Past Medical History:  Diagnosis Date  . HIV infection (HCC)   . Hypertension      Past Surgical History:  Procedure Laterality Date  . CHOLECYSTECTOMY N/A 02/16/2019   Procedure: LAPAROSCOPIC CHOLECYSTECTOMY;  Surgeon: Darnell Level, MD;  Location: [facility];  Service: General;   Laterality: N/A;       Review of Systems  Constitutional: Negative for appetite change, chills, fatigue, fever and unexpected weight change.  Eyes: Negative for visual disturbance.  Respiratory: Negative for cough, chest tightness, shortness of breath and wheezing.   Cardiovascular: Negative for chest pain and leg swelling.  Gastrointestinal: Negative for abdominal pain, constipation, diarrhea, nausea and vomiting.  Genitourinary: Negative for dysuria, flank pain, frequency, genital sores, hematuria and urgency.  Skin: Negative for rash.  Allergic/Immunologic: Negative for immunocompromised state.  Neurological: Negative for dizziness and headaches.      Objective:    BP (!) 169/109   Pulse 98   Temp (!) 97.3 F (36.3 C) (Oral)   Wt (!) 319 lb (144.7 kg)   BMI 44.49 kg/m  Nursing note and vital signs reviewed.  Physical Exam Constitutional:      General: He is not in acute distress.    Appearance: He is well-developed. He is obese.  Eyes:     Conjunctiva/sclera: Conjunctivae normal.  Cardiovascular:     Rate and Rhythm: Normal rate and regular rhythm.     Heart sounds: Normal heart sounds. No murmur heard.  No friction rub. No gallop.   Pulmonary:     Effort: Pulmonary effort is normal. No respiratory distress.     Breath sounds: Normal breath sounds. No wheezing or rales.  Chest:     Chest wall: No tenderness.  Abdominal:     General: Bowel sounds are normal.     Palpations:  Abdomen is soft.     Tenderness: There is no abdominal tenderness.  Musculoskeletal:     Cervical back: Neck supple.  Lymphadenopathy:     Cervical: No cervical adenopathy.  Skin:    General: Skin is warm and dry.     Findings: No rash.  Neurological:     Mental Status: He is alert and oriented to person, place, and time.  Psychiatric:        Behavior: Behavior normal.        Thought Content: Thought content normal.        Judgment: Judgment normal.      Depression screen [facility] 2/9  04/20/2020 10/26/2019 06/18/2019 03/26/2019 12/25/2018  Decreased Interest 0 0 0 0 0  Down, Depressed, Hopeless 0 0 0 0 0  PHQ - 2 Score 0 0 0 0 0       Assessment & Plan:    Patient Active Problem List   Diagnosis Date Noted  . Family history of colonic polyps 11/18/2019  . Family history of breast cancer 11/18/2019  . Snoring 03/26/2019  . Abdominal pain, right upper quadrant 02/15/2019  . Cholelithiasis with chronic cholecystitis 02/14/2019  . Hypokalemia 02/14/2019  . Healthcare maintenance 12/25/2018  . HIV disease (HCC) 06/10/2018  . Essential hypertension 06/10/2018  . Obesity, morbid, BMI 40.0-49.9 (HCC) 06/10/2018     Problem List Items Addressed This Visit      Cardiovascular and Mediastinum   Essential hypertension    Blood pressure remains above goal 140/90 with current dose of medication.  No signs/symptoms concerning for intracranial hemorrhage or end organ damage.  Continue to monitor blood pressure at home.  Working on weight loss and scheduled for bariatric surgery.  Continue current dose of lisinopril.      Relevant Medications   lisinopril (ZESTRIL) 20 MG tablet     Other   HIV disease (HCC) - Primary    Peter Herring continues to have well-controlled HIV disease with good adherence and tolerance to his ART regimen of Biktarvy.  No signs/symptoms of opportunistic infection or progressive HIV disease.  Reviewed previous lab work and discussed plan of care.  Check blood work today.  Continue current dose of Biktarvy.  Plan for follow-up in 4 months or sooner if needed with lab work on the same day.      Relevant Medications   bictegravir-emtricitabine-tenofovir AF (BIKTARVY) 50-200-25 MG TABS tablet   Other Relevant Orders   COMPLETE METABOLIC PANEL WITH GFR   HIV-1 RNA quant-no reflex-bld   T-helper cell (CD4)- ([facility] clinic only)   Obesity, morbid, BMI 40.0-49.9 (HCC)    Peter Herring is scheduled for bariatric surgery in early December.  He is currently in  treatment through [facility].  From ID perspective he has no indications that would prevent him from having surgery.       Other Visit Diagnoses    Screening for STDs (sexually transmitted diseases)       Relevant Orders   RPR       I am having Lyda Perone maintain his lisinopril and bictegravir-emtricitabine-tenofovir AF.   Meds ordered this encounter  Medications  . lisinopril (ZESTRIL) 20 MG tablet    Sig: Take 1 tablet (20 mg total) by mouth daily.    Dispense:  30 tablet    Refill:  5    Order Specific Question:   Supervising Provider    Answer:   Judyann Munson [[id number]]  . bictegravir-emtricitabine-tenofovir AF (BIKTARVY) 50-200-25 MG TABS tablet  Sig: Take 1 tablet by mouth daily.    Dispense:  30 tablet    Refill:  5    Order Specific Question:   Supervising Provider    Answer:   Judyann Munson [[id number]]     Follow-up: Return in about 4 months (around 02/08/2021), or if symptoms worsen or fail to improve.   Marcos Eke, MSN, FNP-C Nurse Practitioner [facility]ious [facility] Main number: [phone]

## 2020-10-11 NOTE — Assessment & Plan Note (Signed)
Peter Herring continues to have well-controlled HIV disease with good adherence and tolerance to his ART regimen of Biktarvy.  No signs/symptoms of opportunistic infection or progressive HIV disease.  Reviewed previous lab work and discussed plan of care.  Check blood work today.  Continue current dose of Biktarvy.  Plan for follow-up in 4 months or sooner if needed with lab work on the same day.

## 2020-10-11 NOTE — Assessment & Plan Note (Signed)
Peter Herring is scheduled for bariatric surgery in early December.  He is currently in treatment through [facility].  From ID perspective he has no indications that would prevent him from having surgery.

## 2020-10-11 NOTE — Assessment & Plan Note (Signed)
Blood pressure remains above goal 140/90 with current dose of medication.  No signs/symptoms concerning for intracranial hemorrhage or end organ damage.  Continue to monitor blood pressure at home.  Working on weight loss and scheduled for bariatric surgery.  Continue current dose of lisinopril.

## 2020-10-12 LAB — T-HELPER CELL (CD4) - (RCID CLINIC ONLY)
CD4 % Helper T Cell: 50 % (ref 33–65)
CD4 T Cell Abs: 1384 /uL (ref 400–1790)

## 2020-10-17 LAB — COMPLETE METABOLIC PANEL WITH GFR
AG Ratio: 1.3 (calc) (ref 1.0–2.5)
ALT: 26 U/L (ref 9–46)
AST: 14 U/L (ref 10–40)
Albumin: 4.2 g/dL (ref 3.6–5.1)
Alkaline phosphatase (APISO): 86 U/L (ref 36–130)
BUN: 8 mg/dL (ref 7–25)
CO2: 27 mmol/L (ref 20–32)
Calcium: 9.3 mg/dL (ref 8.6–10.3)
Chloride: 103 mmol/L (ref 98–110)
Creat: 0.92 mg/dL (ref 0.60–1.35)
GFR, Est African American: 124 mL/min/{1.73_m2} (ref >=60)
GFR, Est Non African American: 107 mL/min/{1.73_m2} (ref >=60)
Globulin: 3.2 g/dL (calc) (ref 1.9–3.7)
Glucose, Bld: 118 mg/dL — ABNORMAL HIGH (ref 65–99)
Potassium: 3.8 mmol/L (ref 3.5–5.3)
Sodium: 139 mmol/L (ref 135–146)
Total Bilirubin: 0.3 mg/dL (ref 0.2–1.2)
Total Protein: 7.4 g/dL (ref 6.1–8.1)

## 2020-10-17 LAB — HIV-1 RNA QUANT-NO REFLEX-BLD
HIV 1 RNA Quant: <20 Copies/mL
HIV-1 RNA Quant, Log: <1.3 Log cps/mL

## 2020-10-17 LAB — RPR: RPR Ser Ql: NONREACTIVE

## 2020-11-07 MED FILL — LISINOPRIL 20 MG TABS: 20 | 30 days supply | Qty: 30 | Fill #0

## 2020-11-07 MED FILL — BIKTARVY 50-200-25 MG TABS: 50-200-25 | 30 days supply | Qty: 30 | Fill #0

## 2020-11-16 ENCOUNTER — Other Ambulatory Visit (HOSPITAL_COMMUNITY): Payer: Self-pay | Admitting: Physician Assistant

## 2020-11-16 MED FILL — SCOPOLAMINE 1 MG/3 DAY PATC: 1 | 30 days supply | Qty: 10 | Fill #0

## 2020-12-05 MED FILL — LISINOPRIL 20 MG TABS: 20 | 30 days supply | Qty: 30 | Fill #1

## 2020-12-05 MED FILL — BIKTARVY 50-200-25 MG TABS: 50-200-25 | 30 days supply | Qty: 30 | Fill #1

## 2020-12-18 IMAGING — NM NUCLEAR MEDICINE HEPATOBILIARY IMAGING WITH GALLBLADDER EF
3 series · 18 of 18 positions shown · non-contrast
Comparison: Ultrasound February 14, 2019

CLINICAL DATA: Cholecystitis/cholangitis. Evaluate for planned
surgery.

EXAM:
NUCLEAR MEDICINE HEPATOBILIARY IMAGING
TECHNIQUE: Sequential images of the abdomen were obtained [DATE] minutes
following intravenous administration of radiopharmaceutical.
RADIOPHARMACEUTICALS:  5.22 mCi Zc-RRm  Choletec IV

[Series 1: biliary · 3.25mm/px · 6 of 60 frames shown (1 of 2)]
[frame 6/60]
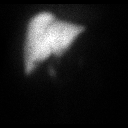
[frame 16/60]
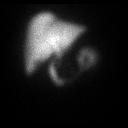
[frame 26/60]
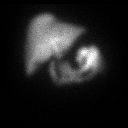
[frame 36/60]
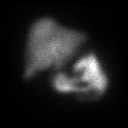
[frame 46/60]
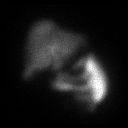
[frame 56/60]
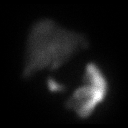

[Series 2: biliary · 3.25mm/px · 6 of 29 frames shown (2 of 2)]
[frame 3/29]
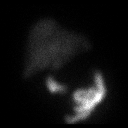
[frame 7/29]
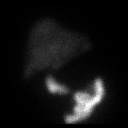
[frame 12/29]
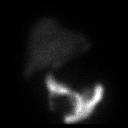
[frame 17/29]
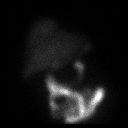
[frame 22/29]
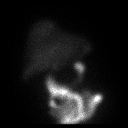
[frame 27/29]
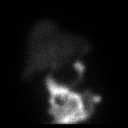

[Series 3: anterior · 3.25mm/px · 6 of 30 frames shown]
[frame 3/30]
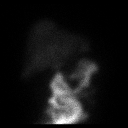
[frame 8/30]
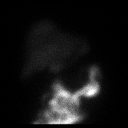
[frame 13/30]
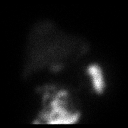
[frame 18/30]
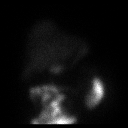
[frame 23/30]
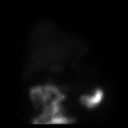
[frame 28/30]
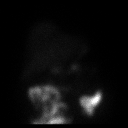

[18 of 18 positions shown; findings below may reference images not displayed]

FINDINGS: There is prompt uptake and washout from the liver. There is normal
excretion into the bowel. No filling of the gallbladder despite the
Cita Chao of morphine.
IMPRESSION: 1. Lack of gallbladder filling may represent acute cholecystitis in
the setting of acute symptoms. Other potential causes include
chronic cholecystitis, a recent meal, prolonged fasting, or severe
illness.

## 2021-02-21 ENCOUNTER — Ambulatory Visit: Payer: BLUE CROSS/BLUE SHIELD | Admitting: Family

## 2021-02-28 ENCOUNTER — Other Ambulatory Visit (HOSPITAL_COMMUNITY): Payer: Self-pay

## 2021-12-28 ENCOUNTER — Telehealth: Payer: Self-pay

## 2021-12-28 NOTE — Telephone Encounter (Signed)
Patient last seen 10/2020 - called to offer appointment, no answer. Left HIPAA compliant voicemail requesting callback.  ° °Phenix Grein D Tavarious Freel, RN ° °

## 2022-03-19 ENCOUNTER — Telehealth: Payer: Self-pay

## 2022-03-19 NOTE — Telephone Encounter (Addendum)
Spoke with patient to offer overdue appointment, he has relocated to [place], [place].  ? ?Notifying [facility].  ? ?Sandie Ano, RN ? ?

## 2024-07-03 DEATH — deceased
